# Patient Record
Sex: Male | Born: 1965 | Race: White | Hispanic: Yes | Marital: Single | Smoking: Current some day smoker
Health system: Southern US, Community
[De-identification: ages and names within clinical notes are randomized; demographics above are authoritative.]

## PROBLEM LIST (undated history)

## (undated) DIAGNOSIS — E785 Hyperlipidemia, unspecified: Secondary | ICD-10-CM

## (undated) HISTORY — PX: OTHER SURGICAL HISTORY: SHX169

## (undated) HISTORY — PX: VASECTOMY: SHX75

## (undated) HISTORY — PX: ROTATOR CUFF REPAIR: SHX139

## (undated) HISTORY — DX: Hyperlipidemia, unspecified: E78.5

---

## 2012-12-22 ENCOUNTER — Emergency Department: Payer: Self-pay | Admitting: Emergency Medicine

## 2013-06-05 ENCOUNTER — Ambulatory Visit: Payer: Self-pay | Admitting: Internal Medicine

## 2013-10-20 IMAGING — US US RENAL KIDNEY
1 series · 14 of 25 positions shown · non-contrast
Comparison: none

REASON FOR EXAM: Left flank pain Mild hematuria
COMMENTS:

[Series 1: us renal kidney · 0.28mm/px · 14 of 40 slices shown]
[im 1/40]
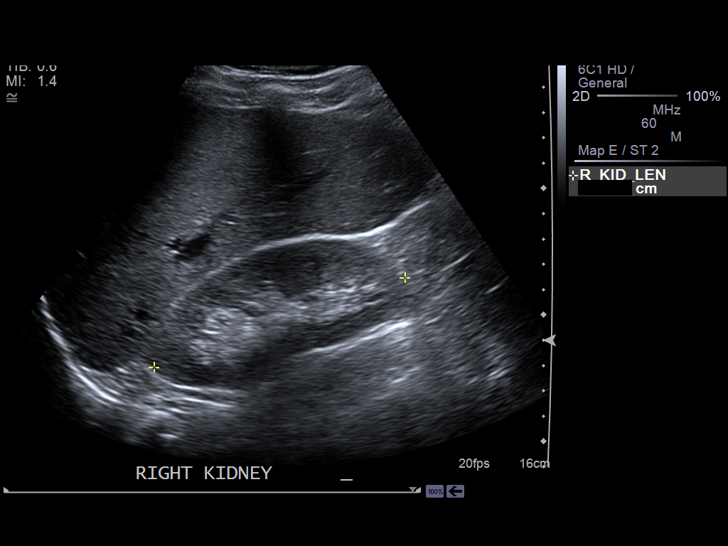
[im 4/40]
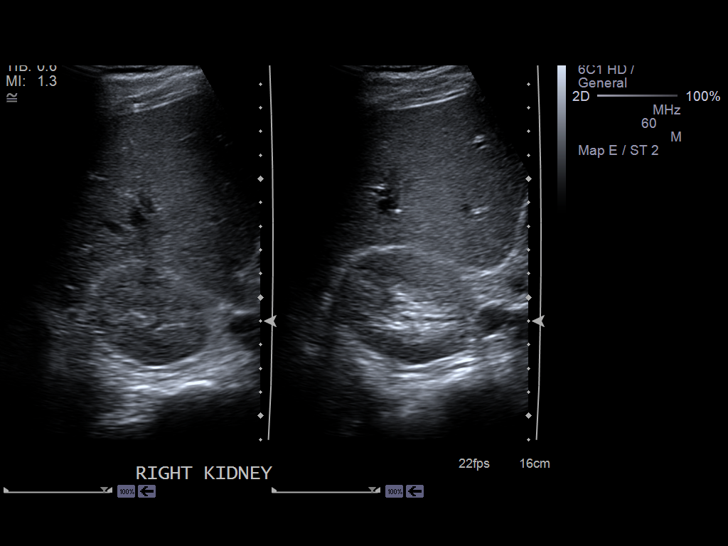
[im 7/40]
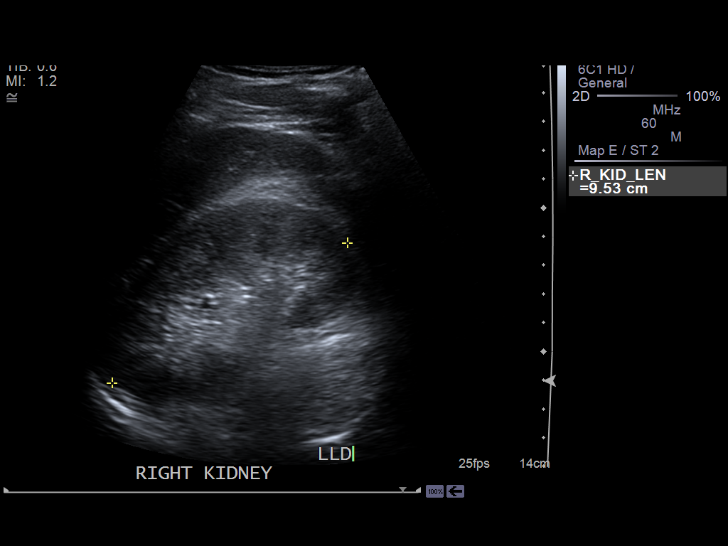
[im 10/40]
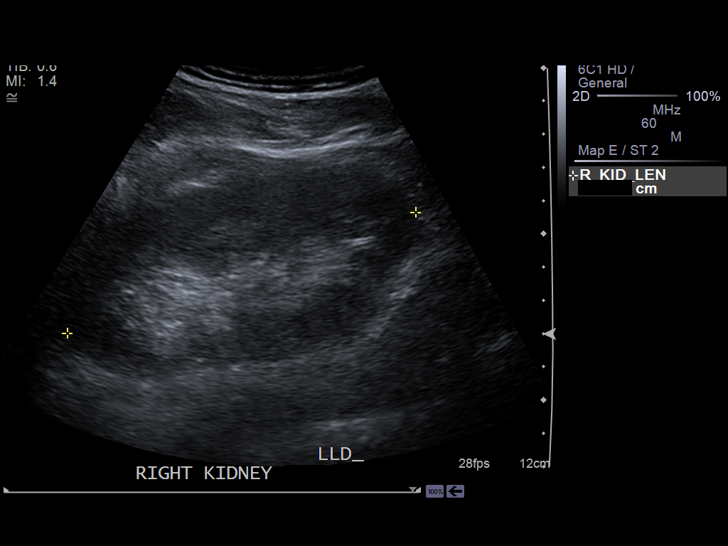
[im 14/40]
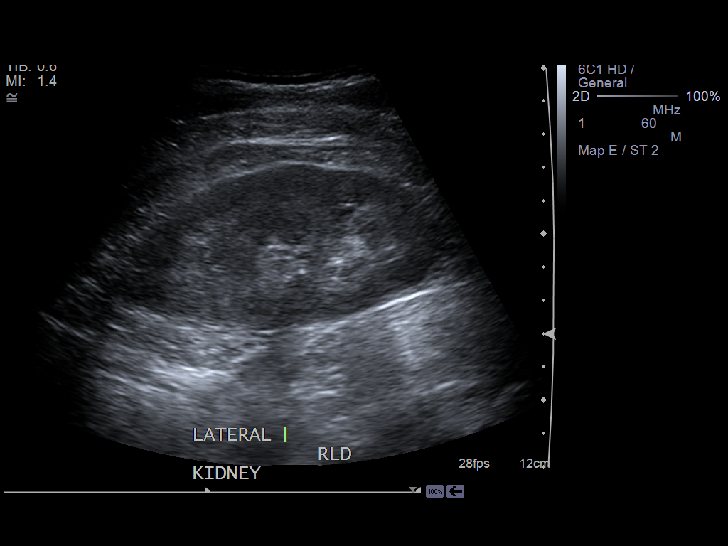
[im 15/40]
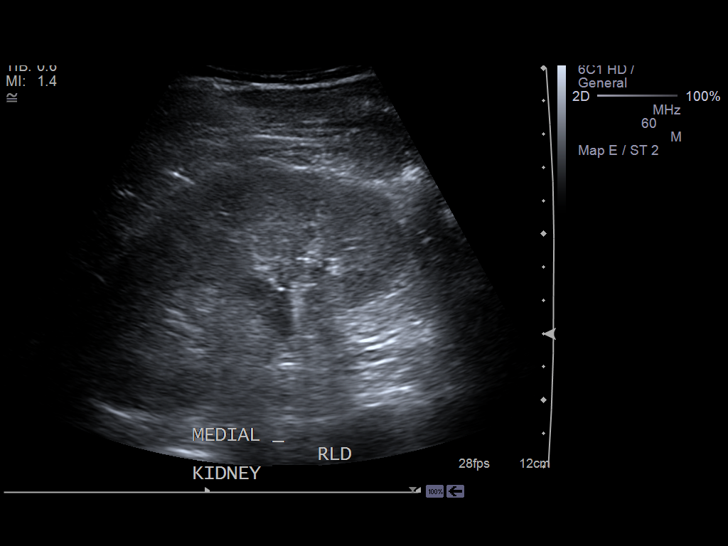
[im 18/40]
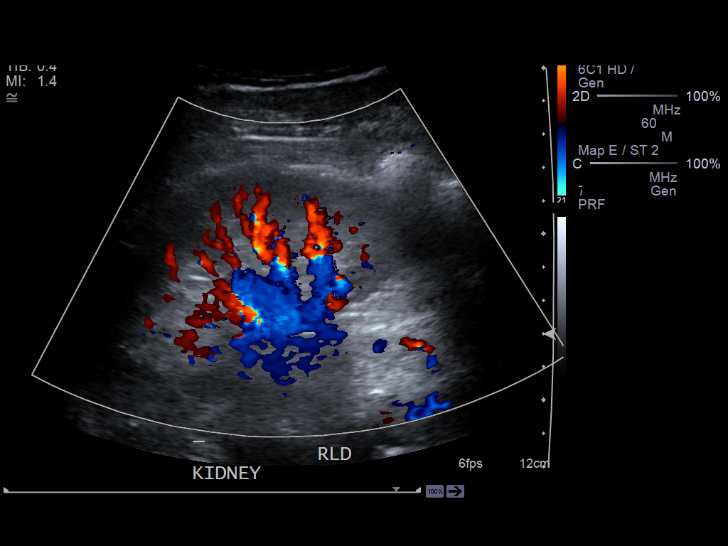
[im 22/40]
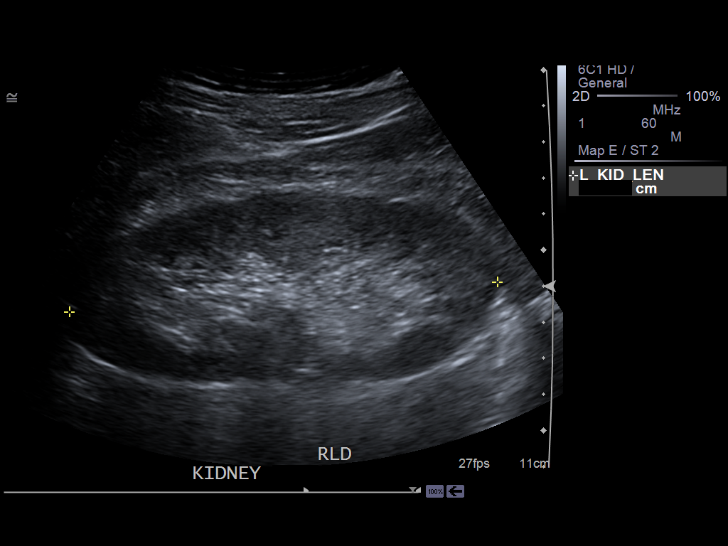
[im 25/40]
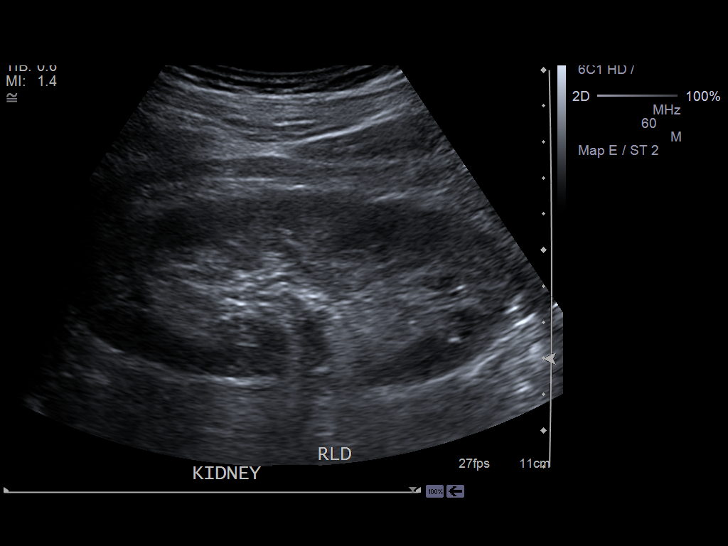
[im 27/40]
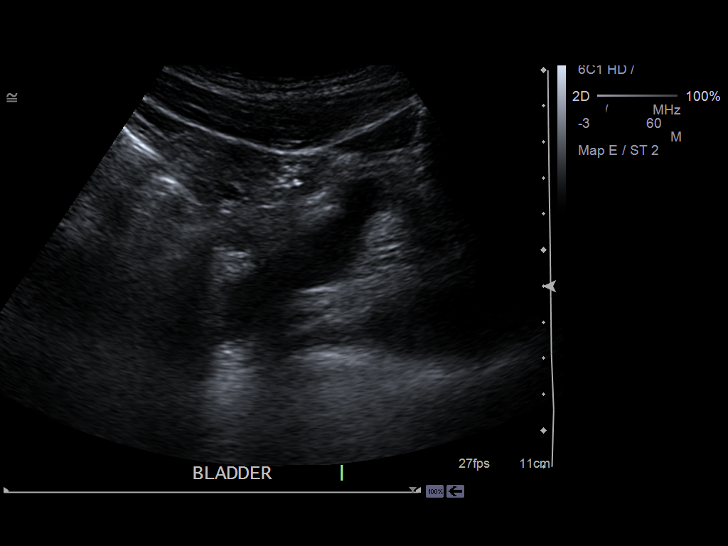
[im 30/40]
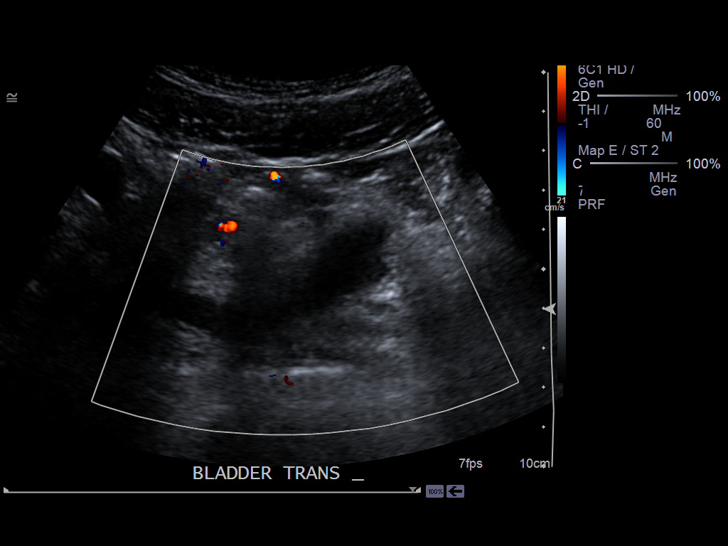
[im 33/40]
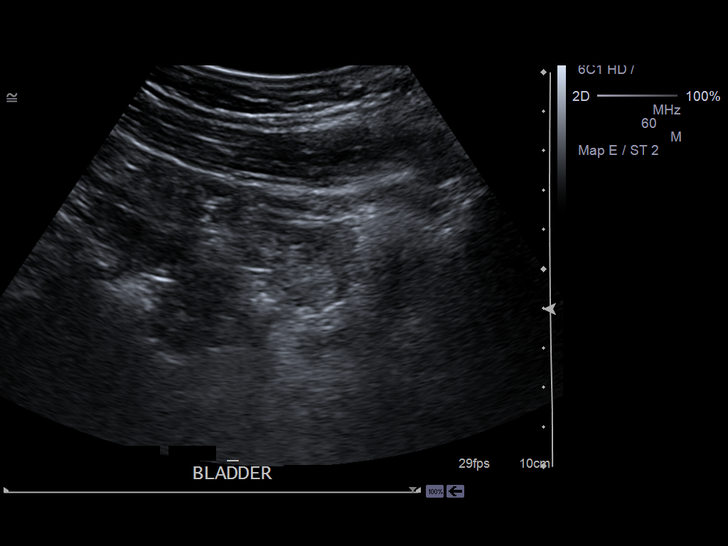
[im 36/40]
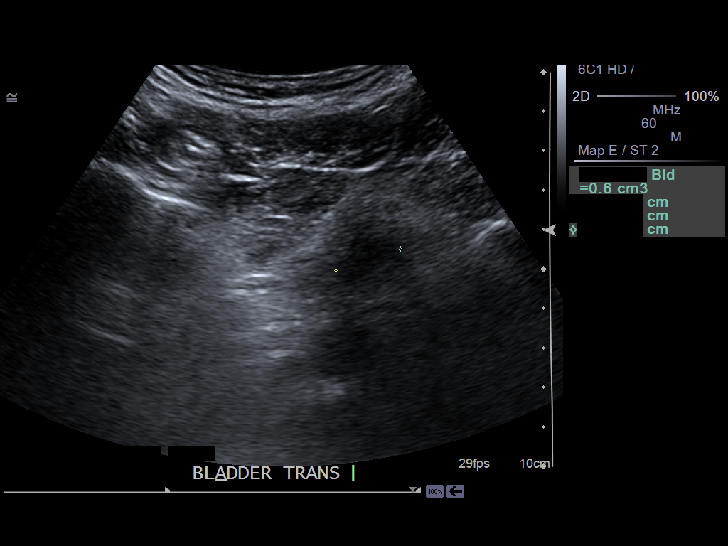
[im 40/40]
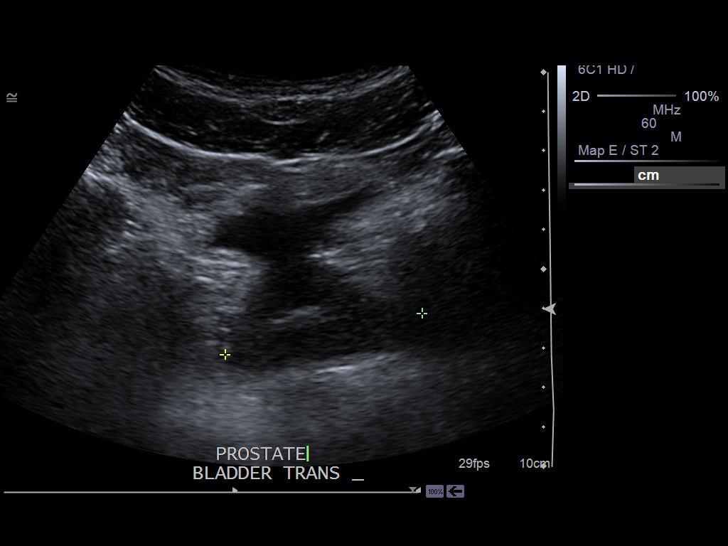

[14 of 25 positions shown; findings below may reference images not displayed]

PROCEDURE:     [facility] - [facility] KIDNE[facility]  - June 05, 2013  [DATE]

RESULT:     Comparison: None

Technique and findings: Multiple gray-scale and color doppler images of the
kidneys were obtained.

The right kidney measures 11.1 x 5.9 x 5.3 cm and the left kidney measures
11.9 x 5.2 x 5.3 cm. The kidneys are normal in echogenicity. There is no
hydronephrosis.  There are no echogenic foci.  There are no renal masses.
There is no free fluid in the region of the renal fossa. The prevoid bladder
volume measures 31.7 cc. The post void bladder volume measures 0.63 cc. The
prostate is mildly enlarged measuring 5.6 x 2.6 x 5.1 cm.
IMPRESSION: Normal renal ultrasound.

Prostatomegaly. Correlate with physical exam and laboratory values.

[REDACTED]

## 2015-02-27 ENCOUNTER — Ambulatory Visit: Payer: Self-pay | Admitting: Internal Medicine

## 2015-07-14 IMAGING — CR DG CHEST 2V
1 series · 2 of 2 positions shown · non-contrast
Comparison: None.

CLINICAL DATA: Chronic intermittent left posterior upper chest pain
for the past year, nonsmoker

EXAM:
CHEST  2 VIEW

[Series 1: pa · 0.17mm/px · 2 of 2 slices shown]
[im 1/2]
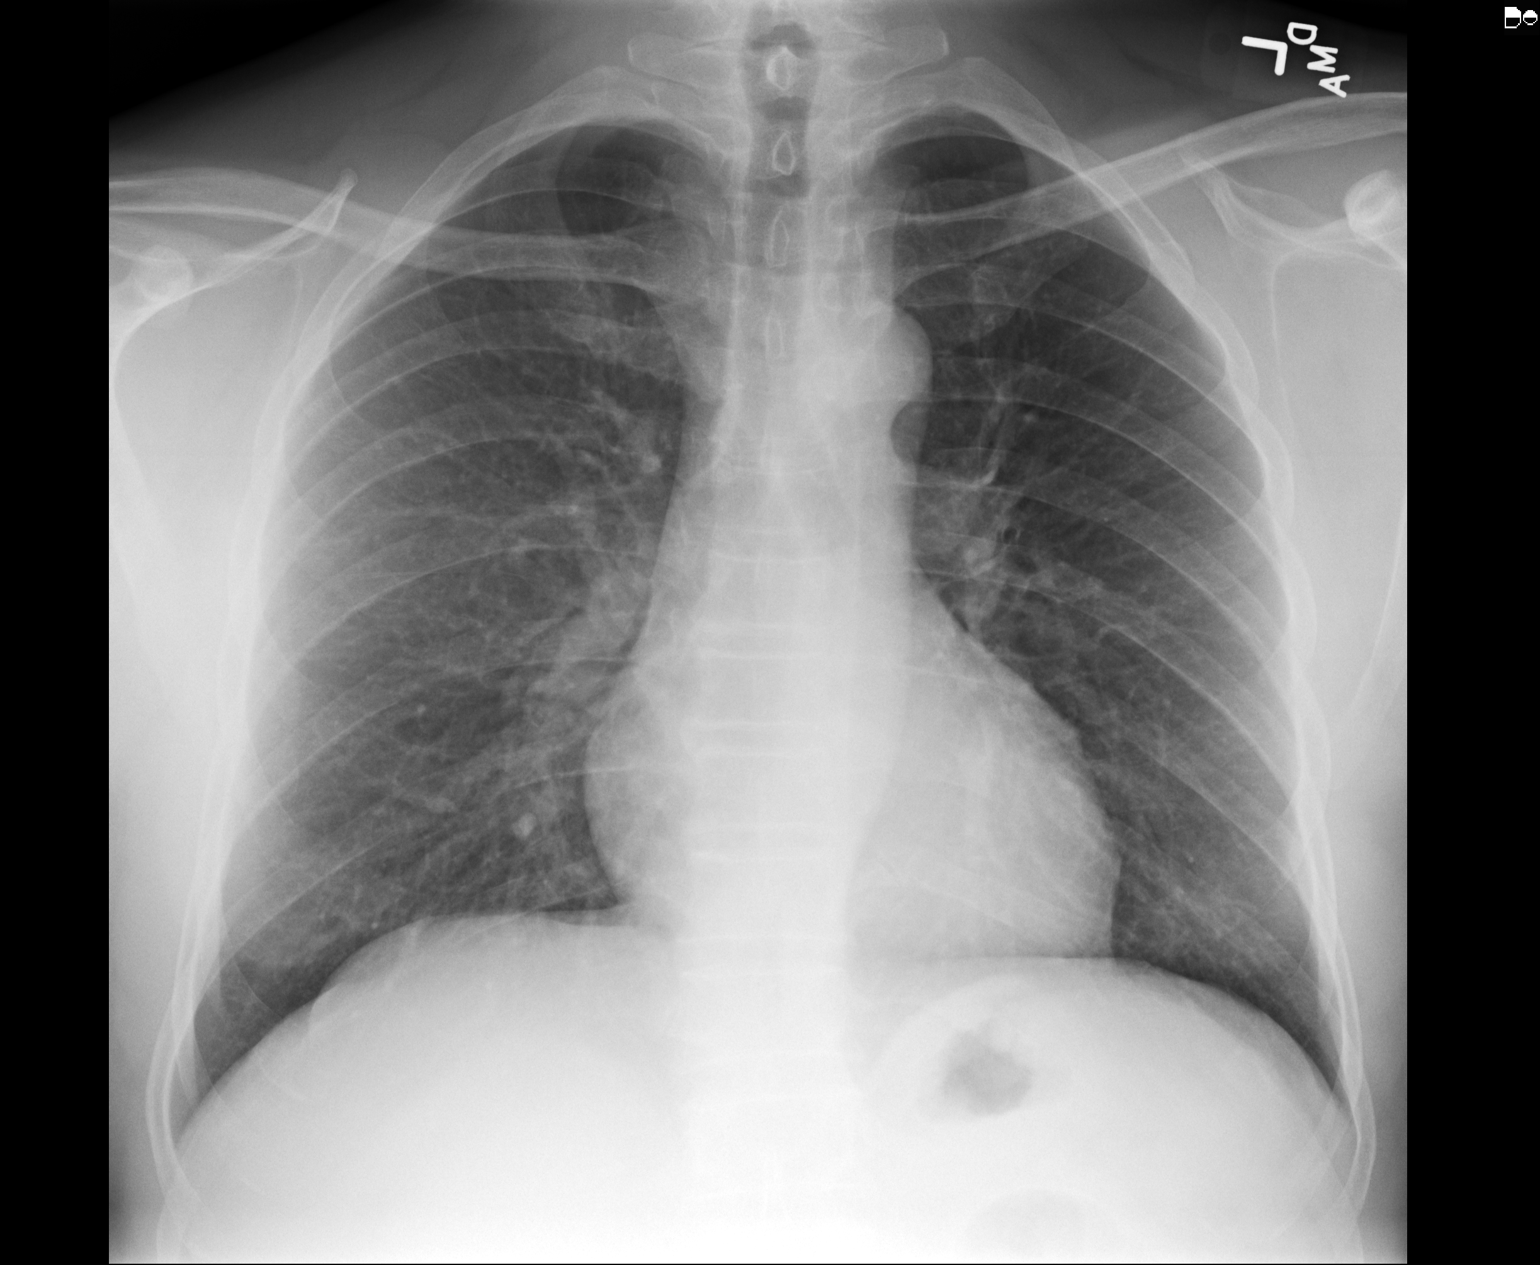
[im 2/2]
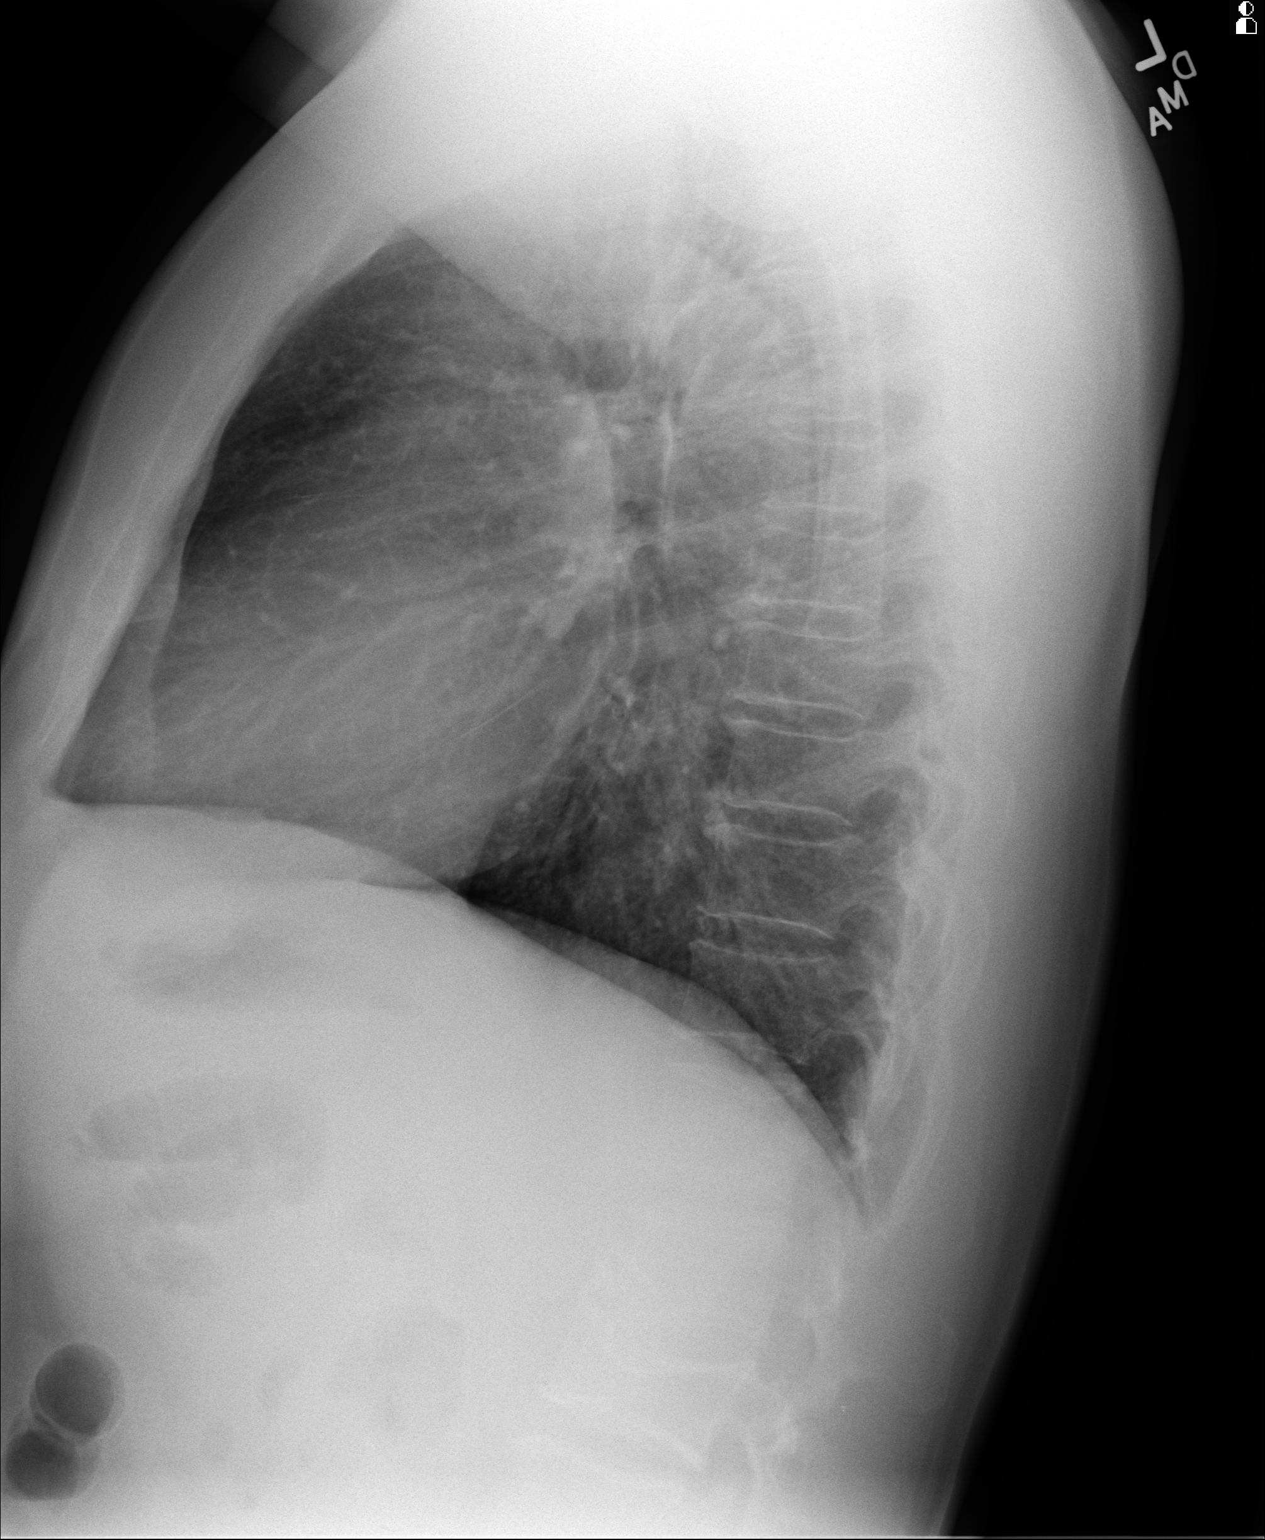

[2 of 2 positions shown; findings below may reference images not displayed]

FINDINGS: The lungs are adequately inflated and clear. The heart and pulmonary
vascularity are normal. The mediastinum is normal in width. There is
no pleural effusion. There is mild tortuosity of the descending
thoracic aorta. There is loss of height of the body of L2 or L3.
IMPRESSION: There is no active cardiopulmonary disease. No bony abnormality of
the thorax is demonstrated. There is 40% anterior compression of an
upper lumbar vertebral body.

## 2016-09-27 ENCOUNTER — Encounter: Payer: Self-pay | Admitting: Podiatry

## 2016-09-27 ENCOUNTER — Ambulatory Visit (INDEPENDENT_AMBULATORY_CARE_PROVIDER_SITE_OTHER): Payer: BLUE CROSS/BLUE SHIELD

## 2016-09-27 ENCOUNTER — Ambulatory Visit (INDEPENDENT_AMBULATORY_CARE_PROVIDER_SITE_OTHER): Payer: BLUE CROSS/BLUE SHIELD | Admitting: Podiatry

## 2016-09-27 VITALS — BP 139/96 | HR 59 | Resp 16 | Ht 68.0 in | Wt 190.0 lb

## 2016-09-27 DIAGNOSIS — M79672 Pain in left foot: Secondary | ICD-10-CM

## 2016-09-27 DIAGNOSIS — M722 Plantar fascial fibromatosis: Secondary | ICD-10-CM

## 2016-09-27 DIAGNOSIS — B353 Tinea pedis: Secondary | ICD-10-CM

## 2016-09-27 MED ORDER — CLOTRIMAZOLE-BETAMETHASONE 1-0.05 % EX CREA: 1.0000 "application " | TOPICAL_CREAM | Freq: Two times a day (BID) | CUTANEOUS | 2 refills | Status: DC

## 2016-09-27 MED ORDER — TERBINAFINE HCL 250 MG PO TABS: 250.0000 mg | ORAL_TABLET | Freq: Every day | ORAL | 0 refills | Status: DC

## 2016-09-27 NOTE — Patient Instructions (Signed)
Plantar Fasciitis Plantar fasciitis is a painful foot condition that affects the heel. It occurs when the band of tissue that connects the toes to the heel bone (plantar fascia) becomes irritated. This can happen after exercising too much or doing other repetitive activities (overuse injury). The pain from plantar fasciitis can range from mild irritation to severe pain that makes it difficult for you to walk or move. The pain is usually worse in the morning or after you have been sitting or lying down for a while. CAUSES This condition may be caused by:  Standing for long periods of time.  Wearing shoes that do not fit.  Doing high-impact activities, including running, aerobics, and ballet.  Being overweight.  Having an abnormal way of walking (gait).  Having tight calf muscles.  Having high arches in your feet.  Starting a new athletic activity. SYMPTOMS The main symptom of this condition is heel pain. Other symptoms include:  Pain that gets worse after activity or exercise.  Pain that is worse in the morning or after resting.  Pain that goes away after you walk for a few minutes. DIAGNOSIS This condition may be diagnosed based on your signs and symptoms. Your health care provider will also do a physical exam to check for:  A tender area on the bottom of your foot.  A high arch in your foot.  Pain when you move your foot.  Difficulty moving your foot. You may also need to have imaging studies to confirm the diagnosis. These can include:  X-rays.  Ultrasound.  MRI. TREATMENT  Treatment for plantar fasciitis depends on the severity of the condition. Your treatment may include:  Rest, ice, and over-the-counter pain medicines to manage your pain.  Exercises to stretch your calves and your plantar fascia.  A splint that holds your foot in a stretched, upward position while you sleep (night splint).  Physical therapy to relieve symptoms and prevent problems in the  future.  Cortisone injections to relieve severe pain.  Extracorporeal shock wave therapy (ESWT) to stimulate damaged plantar fascia with electrical impulses. It is often used as a last resort before surgery.  Surgery, if other treatments have not worked after 12 months. HOME CARE INSTRUCTIONS  Take medicines only as directed by your health care provider.  Avoid activities that cause pain.  Roll the bottom of your foot over a bag of ice or a bottle of cold water. Do this for 20 minutes, 3-4 times a day.  Perform simple stretches as directed by your health care provider.  Try wearing athletic shoes with air-sole or gel-sole cushions or soft shoe inserts.  Wear a night splint while sleeping, if directed by your health care provider.  Keep all follow-up appointments with your health care provider. PREVENTION   Do not perform exercises or activities that cause heel pain.  Consider finding low-impact activities if you continue to have problems.  Lose weight if you need to. The best way to prevent plantar fasciitis is to avoid the activities that aggravate your plantar fascia. SEEK MEDICAL CARE IF:  Your symptoms do not go away after treatment with home care measures.  Your pain gets worse.  Your pain affects your ability to move or do your daily activities.   This information is not intended to replace advice given to you by your health care provider. Make sure you discuss any questions you have with your health care provider.   Document Released: 09/06/2001 Document Revised: 09/02/2015 Document Reviewed: 10/22/2014 Elsevier   Interactive Patient Education 2016 [employer]  

## 2016-09-27 NOTE — Progress Notes (Signed)
   Subjective:    Patient ID: Gavin Chaney, male    DOB: 18-May-1966, 50 y.o.   MRN: [id number]  HPI    Review of Systems  Constitutional: Positive for fatigue.  HENT: Positive for tinnitus.   Musculoskeletal: Positive for back pain.       Objective:   Physical Exam        Assessment & Plan:

## 2016-10-02 MED ORDER — BETAMETHASONE SOD PHOS & ACET 6 (3-3) MG/ML IJ SUSP: 12.0000 mg | Freq: Once | INTRAMUSCULAR | Status: DC

## 2016-10-02 NOTE — Progress Notes (Signed)
Patient ID: Gavin ArmsDaniel R Chaney, male   DOB: 21-Feb-1966, 50 y.o.   MRN: [id number] Subjective: Patient presents today for pain and tenderness in the left foot. Patient states the foot pain has been hurting for several weeks now. Patient states that it hurts in the mornings with the first steps out of bed. Patient states that he's noticed the pain for proximal he 6 months now. Patient also has a complaint of itching and dry skin to the bilateral feet.  Patient presents today for further treatment and evaluation  Objective: Physical Exam General: The patient is alert and oriented x3 in no acute distress.  Dermatology: Severe right dry skin noted to the bilateral feet consistent with moccasin-type tinea pedis. Pruritus noted. Skin is warm, dry and supple bilateral lower extremities. Negative for open lesions or macerations bilateral.   Vascular: Dorsalis Pedis and Posterior Tibial pulses palpable bilateral.  Capillary fill time is immediate to all digits.  Neurological: Epicritic and protective threshold intact bilateral.   Musculoskeletal: Tenderness to palpation at the medial calcaneal tubercale and through the insertion of the plantar fascia of the left foot. All other joints range of motion within normal limits bilateral. Strength 5/5 in all groups bilateral.   Radiographic exam: Normal osseous mineralization. Joint spaces preserved. No fracture/dislocation/boney destruction. Calcaneal spur present with mild thickening of plantar fascia left. No other soft tissue abnormalities or radiopaque foreign bodies.   Assessment: 1. Plantar fasciitis left foot 2. Pain in left foot 3. Tinea pedis bilateral feet  Plan of Care:  1. Patient evaluated. Xrays reviewed.   2. Injection of 0.5cc Celestone soluspan injected into the left plantar fascia.  3. Instructed patient regarding therapies and modalities at home to alleviate symptoms.  4. Patient is to continue meloxicam did prescribed from his primary  care doctor 5. Prescription for Lamisil 28 pills and Lotrisone cream prescribed today 6. Plantar fascial band(s) dispensed. 7. Today the patient was scanned for custom molded orthotics.  Return to clinic in 4 weeks.

## 2016-10-25 ENCOUNTER — Ambulatory Visit (INDEPENDENT_AMBULATORY_CARE_PROVIDER_SITE_OTHER): Payer: BLUE CROSS/BLUE SHIELD | Admitting: Podiatry

## 2016-10-25 DIAGNOSIS — M79672 Pain in left foot: Secondary | ICD-10-CM

## 2016-10-25 DIAGNOSIS — M722 Plantar fascial fibromatosis: Secondary | ICD-10-CM

## 2016-10-25 DIAGNOSIS — B353 Tinea pedis: Secondary | ICD-10-CM

## 2016-10-25 MED ORDER — TERBINAFINE HCL 250 MG PO TABS: 250.0000 mg | ORAL_TABLET | Freq: Every day | ORAL | 0 refills | Status: DC

## 2016-10-25 MED ORDER — MELOXICAM 15 MG PO TABS: 15.0000 mg | ORAL_TABLET | Freq: Every day | ORAL | 1 refills | Status: AC

## 2016-10-26 ENCOUNTER — Encounter: Payer: Self-pay | Admitting: Podiatry

## 2016-10-30 NOTE — Progress Notes (Signed)
Patient ID: Gavin ArmsDaniel R Ohlsen, male   DOB: 1966-09-18, 50 y.o.   MRN: [id number] Subjective: Patient presents today for pain and tenderness in the left foot. Patient states the foot pain has been hurting for several weeks now. Patient states that it hurts in the mornings with the first steps out of bed. Patient states that he's noticed the pain for proximal he 6 months now. Patient also has a complaint of itching and dry skin to the bilateral feet.  Patient presents today for further treatment and evaluation  Objective: Physical Exam General: The patient is alert and oriented x3 in no acute distress.  Dermatology: Severe right dry skin noted to the bilateral feet consistent with moccasin-type tinea pedis. Pruritus noted. Skin is warm, dry and supple bilateral lower extremities. Negative for open lesions or macerations bilateral.   Vascular: Dorsalis Pedis and Posterior Tibial pulses palpable bilateral.  Capillary fill time is immediate to all digits.  Neurological: Epicritic and protective threshold intact bilateral.   Musculoskeletal: Tenderness to palpation at the medial calcaneal tubercale and through the insertion of the plantar fascia of the left foot. All other joints range of motion within normal limits bilateral. Strength 5/5 in all groups bilateral.   Radiographic exam: Normal osseous mineralization. Joint spaces preserved. No fracture/dislocation/boney destruction. Calcaneal spur present with mild thickening of plantar fascia left. No other soft tissue abnormalities or radiopaque foreign bodies.   Assessment: 1. Plantar fasciitis left foot 2. Pain in left foot 3. Tinea pedis bilateral feet  Plan of Care:  1. Patient evaluated. Xrays reviewed.   2. Injection of 0.5cc Celestone soluspan injected into the left plantar fascia.  3. Instructed patient regarding therapies and modalities at home to alleviate symptoms.  4. Patient is to continue meloxicam did prescribed from his primary  care doctor. Today meloxicam prescription was refilled 5. Prescription for Lamisil 28 pills and Lotrisone cream prescribed today 6. Today the patient was re-scanned for custom molded orthotics since the order was lost last visit. 7. Return to clinic in 4 weeks for orthotic pickup.

## 2016-11-22 ENCOUNTER — Ambulatory Visit (INDEPENDENT_AMBULATORY_CARE_PROVIDER_SITE_OTHER): Payer: BLUE CROSS/BLUE SHIELD | Admitting: Podiatry

## 2016-11-22 DIAGNOSIS — M722 Plantar fascial fibromatosis: Secondary | ICD-10-CM

## 2016-11-22 NOTE — Patient Instructions (Signed)

## 2016-11-27 NOTE — Progress Notes (Signed)
Patient presents for orthotics pickup. Orthotics were dispensed today with both verbal and written instructions for orthotics break-in period and care. Return to clinic in 3 weeks for follow-up evaluation.  Brent M. Evans, DPM [facility]  Dr. Brent M. Evans, DPM   [address]                                        , [place] [address]                Office [phone]  Fax [phone]   

## 2016-12-23 ENCOUNTER — Encounter: Payer: Self-pay | Admitting: Podiatry

## 2016-12-23 ENCOUNTER — Ambulatory Visit (INDEPENDENT_AMBULATORY_CARE_PROVIDER_SITE_OTHER): Payer: BLUE CROSS/BLUE SHIELD | Admitting: Podiatry

## 2016-12-23 DIAGNOSIS — M79672 Pain in left foot: Secondary | ICD-10-CM | POA: Diagnosis not present

## 2016-12-23 DIAGNOSIS — M722 Plantar fascial fibromatosis: Secondary | ICD-10-CM

## 2016-12-23 MED ORDER — METHYLPREDNISOLONE 4 MG PO TBPK: ORAL_TABLET | ORAL | 0 refills | Status: DC

## 2016-12-23 MED ORDER — BETAMETHASONE SOD PHOS & ACET 6 (3-3) MG/ML IJ SUSP: 3.0000 mg | Freq: Once | INTRAMUSCULAR | Status: DC

## 2016-12-23 NOTE — Progress Notes (Signed)
Subjective: Patient presents today for follow-up evaluation of plantar fasciitis to the left foot. Patient states that he still has ongoing pain. Patient received custom molded orthotics on 11/22/2016. Patient states that his pain is intermittent with some days better than others. Patient exercises at [employer] and he works on his feet all day. Patient presents today for follow-up evaluation and treatment.  Objective: Physical Exam General: The patient is alert and oriented x3 in no acute distress.  Dermatology: Skin is warm, dry and supple bilateral lower extremities. Negative for open lesions or macerations bilateral.   Vascular: Dorsalis Pedis and Posterior Tibial pulses palpable bilateral.  Capillary fill time is immediate to all digits.  Neurological: Epicritic and protective threshold intact bilateral.   Musculoskeletal: Tenderness to palpation at the medial calcaneal tubercale and through the insertion of the plantar fascia of the left foot. All other joints range of motion within normal limits bilateral. Strength 5/5 in all groups bilateral.   Radiographic exam:   Normal osseous mineralization. Joint spaces preserved. No fracture/dislocation/boney destruction. Calcaneal spur present with mild thickening of plantar fascia left. No other soft tissue abnormalities or radiopaque foreign bodies.   Assessment: 1. Plantar fasciitis left foot 2. Pain in left foot  Plan of Care:   1. Patient evaluated. Xrays reviewed.   2. Injection of 0.5cc Celestone soluspan injected into the left plantar fascia.  3. Instructed patient regarding therapies and modalities at home to alleviate symptoms.  4. Prescription for Medrol Dosepak 5. Continue custom orthotics, plantar fascial band, meloxicam, and daily stretching exercises 6. Return to clinic in 4 weeks

## 2017-01-24 ENCOUNTER — Ambulatory Visit (INDEPENDENT_AMBULATORY_CARE_PROVIDER_SITE_OTHER): Payer: BLUE CROSS/BLUE SHIELD | Admitting: Podiatry

## 2017-01-24 DIAGNOSIS — M722 Plantar fascial fibromatosis: Secondary | ICD-10-CM | POA: Diagnosis not present

## 2017-01-24 DIAGNOSIS — M79672 Pain in left foot: Secondary | ICD-10-CM

## 2017-01-25 ENCOUNTER — Telehealth: Payer: Self-pay | Admitting: *Deleted

## 2017-01-25 DIAGNOSIS — M79672 Pain in left foot: Secondary | ICD-10-CM

## 2017-01-25 DIAGNOSIS — M722 Plantar fascial fibromatosis: Secondary | ICD-10-CM

## 2017-01-25 NOTE — Telephone Encounter (Addendum)
-----   Message from Felecia ShellingBrent M Evans, DPM sent at 01/24/2017 10:19 AM EST ----- Regarding: Physical Therapy Please arrange physical therapy 3x/week x 4 weeks. Patient does not want to go to [facility].   Dx: plantar fasciitis left foot.   Thanks, Dr. Logan BoresEvans. 01/25/2017-Faxed required form, demographics and LOV to [employer].

## 2017-02-04 NOTE — Progress Notes (Signed)
Subjective: Patient presents today for follow-up treatment of plantar fasciitis left foot. Patient states that he is not any better. Patient is wearing his custom molded orthotics which she received 11/22/2016. Patient states that he has good days and bad days. There are no alleviating or aggravating factors that he can think of.  Objective: Physical Exam General: The patient is alert and oriented x3 in no acute distress.  Dermatology: Skin is warm, dry and supple bilateral lower extremities. Negative for open lesions or macerations bilateral.   Vascular: Dorsalis Pedis and Posterior Tibial pulses palpable bilateral.  Capillary fill time is immediate to all digits.  Neurological: Epicritic and protective threshold intact bilateral.   Musculoskeletal: Tenderness to palpation at the medial calcaneal tubercale and through the insertion of the plantar fascia of the left foot. All other joints range of motion within normal limits bilateral. Strength 5/5 in all groups bilateral.   Radiographic exam:   Normal osseous mineralization. Joint spaces preserved. No fracture/dislocation/boney destruction. Calcaneal spur present with mild thickening of plantar fascia left. No other soft tissue abnormalities or radiopaque foreign bodies.   Assessment: 1. Plantar fasciitis left foot 2. Pain in left foot  Plan of Care:   1. Patient evaluated. Xrays reviewed.   2. Today we discussed additional modalities including physical therapy and shockwave therapy as well as possible surgery. 3. Today redressed and the patient to physical therapy 3 times per week 4 weeks 4. Return to clinic in 4 weeks  5. The patient is not better we will further discuss shockwave EPAT versus surgical intervention

## 2017-02-21 ENCOUNTER — Ambulatory Visit (INDEPENDENT_AMBULATORY_CARE_PROVIDER_SITE_OTHER): Payer: BLUE CROSS/BLUE SHIELD | Admitting: Podiatry

## 2017-02-21 ENCOUNTER — Encounter: Payer: Self-pay | Admitting: Podiatry

## 2017-02-21 DIAGNOSIS — M722 Plantar fascial fibromatosis: Secondary | ICD-10-CM | POA: Diagnosis not present

## 2017-02-21 DIAGNOSIS — M79672 Pain in left foot: Secondary | ICD-10-CM

## 2017-02-21 MED ORDER — MELOXICAM 15 MG PO TABS: 15.0000 mg | ORAL_TABLET | Freq: Every day | ORAL | 3 refills | Status: DC

## 2017-02-26 NOTE — Progress Notes (Signed)
Subjective: Patient presents today for follow-up treatment of plantar fasciitis to the left foot. Patient states that the left foot pain is worse. He states that physical therapy is not helping. Patient is very discouraged because he has been dealing with this pathology for several months now. Physical therapy not helping. Injections did not help. Patient is wearing orthotics that he states they are not helping to alleviate symptoms. She states that he has good days and bad days.  Objective: Physical Exam General: The patient is alert and oriented x3 in no acute distress.  Dermatology: Skin is warm, dry and supple bilateral lower extremities. Negative for open lesions or macerations bilateral.   Vascular: Dorsalis Pedis and Posterior Tibial pulses palpable bilateral.  Capillary fill time is immediate to all digits.  Neurological: Epicritic and protective threshold intact bilateral.   Musculoskeletal: Tenderness to palpation at the medial calcaneal tubercale and through the insertion of the plantar fascia of the left foot. All other joints range of motion within normal limits bilateral. Strength 5/5 in all groups bilateral.   Assessment: 1. Plantar fasciitis left foot 2. Pain in left foot  Plan of Care:   1. Patient evaluated. Xrays reviewed.   2. Today were going to [place] EPAT shock[place]  3. Discontinue physical therapy 4. Prescription for meloxicam 15 mg 5. Return to clinic after the full course and treatment of EPAT

## 2017-02-27 ENCOUNTER — Ambulatory Visit: Payer: Self-pay | Admitting: Podiatry

## 2017-02-27 DIAGNOSIS — M722 Plantar fascial fibromatosis: Secondary | ICD-10-CM

## 2017-02-27 DIAGNOSIS — R52 Pain, unspecified: Secondary | ICD-10-CM

## 2017-03-01 NOTE — Progress Notes (Signed)
   Subjective:    Patient ID: Gavin Chaney, male    DOB: 08/08/1966, 51 y.o.   MRN: [id number]  HPI Pt presents today stating that he has had chronic pain in his Lt heel, he has tried various therapies with not much success and is here today for ESWT treatment   Review of Systems  All other systems are negative Objective:   Physical Exam    Pain on palpation of left medial heel band with noted mild swelling      Assessment & Plan:  ESWT administered to Lt heel at 8.5 joules for 3000 pulses and tolerated well. EPAt therapy administered to entire plantar fascia for 3000 pulses. Advised to avoid regular use of anti-inflammatories and ice. Advised on supportive shoe usage. Follow up in 1 week for 2nd treatment

## 2017-03-07 ENCOUNTER — Ambulatory Visit (INDEPENDENT_AMBULATORY_CARE_PROVIDER_SITE_OTHER): Payer: BLUE CROSS/BLUE SHIELD

## 2017-03-07 DIAGNOSIS — M722 Plantar fascial fibromatosis: Secondary | ICD-10-CM

## 2017-03-13 ENCOUNTER — Ambulatory Visit: Payer: BLUE CROSS/BLUE SHIELD

## 2017-03-13 DIAGNOSIS — M722 Plantar fascial fibromatosis: Secondary | ICD-10-CM

## 2017-03-16 NOTE — Progress Notes (Signed)
   Subjective:    Patient ID: Gavin Chaney, male    DOB: 11-Jun-1966, 51 y.o.   MRN: [id number]  HPI Pt presents today stating that he has had chronic pain in his Lt heel, he has not noticed much improvement since starting therapy  Review of Systems  All other systems are negative Objective:   Physical Exam    Pain on palpation of left medial heel band with noted mild swelling      Assessment & Plan:  ESWT administered to Lt heel at 12 joules for 3000 pulses and tolerated well. EPAt therapy administered to entire plantar fascia for 3000 pulses. Advised to avoid regular use of anti-inflammatories and ice. Advised on supportive shoe usage. Follow up in 1 week for 3rd treatment

## 2017-03-29 NOTE — Progress Notes (Signed)
   Subjective:    Patient ID: Gavin Chaney, male    DOB: 1966-01-11, 51 y.o.   MRN: [id number]  HPI Pt presents today stating that he has had chronic pain in his Lt heel, he has tried various therapies with not much success and is here today for ESWT treatment. He has not noticed much improvement in his pain.   Review of Systems  All other systems are negative Objective:   Physical Exam    Pain on palpation of left medial heel band with noted mild swelling      Assessment & Plan:  ESWT administered to Lt heel at 12 joules for 3000 pulses and tolerated well. EPAt therapy administered to entire plantar fascia for 3000 pulses. Advised to avoid regular use of anti-inflammatories and ice. Advised on supportive shoe usage. Follow up in 4 weeks for 4th treatment.

## 2017-04-10 ENCOUNTER — Ambulatory Visit (INDEPENDENT_AMBULATORY_CARE_PROVIDER_SITE_OTHER): Payer: Self-pay | Admitting: Podiatry

## 2017-04-10 DIAGNOSIS — M722 Plantar fascial fibromatosis: Secondary | ICD-10-CM

## 2017-04-10 NOTE — Progress Notes (Signed)
   Subjective:    Patient ID: Gavin Chaney, male    DOB: Apr 11, 1966, 51 y.o.   MRN: [id number]  HPI Pt presents today stating that he has had chronic pain in his Lt heel, he has noticed slight  improvement since starting therapy  Review of Systems  All other systems are negative Objective:   Physical Exam    Pain on palpation of left medial heel band with noted mild swelling      Assessment & Plan:  ESWT administered to Lt heel at 15 joules for 3000 pulses and tolerated well. EPAt therapy administered to entire plantar fascia for 3000 pulses. Advised to avoid regular use of anti-inflammatories and ice. Advised on supportive shoe usage. Follow up in 1 week for 3rd treatment

## 2017-05-29 ENCOUNTER — Encounter: Payer: Self-pay | Admitting: Podiatry

## 2017-05-29 ENCOUNTER — Ambulatory Visit (INDEPENDENT_AMBULATORY_CARE_PROVIDER_SITE_OTHER): Payer: BLUE CROSS/BLUE SHIELD | Admitting: Podiatry

## 2017-05-29 DIAGNOSIS — M722 Plantar fascial fibromatosis: Secondary | ICD-10-CM

## 2017-06-13 NOTE — Progress Notes (Signed)
   Subjective:    Patient ID: Gavin Chaney, male    DOB: 1966-08-15, 51 y.o.   MRN: [id number]  HPI Pt presents today stating that he has had chronic pain in his Lt heel, he has tried various therapies with not much success and is here today for ESWT treatment. Review of Systems He has noticed an improvement in his pain and is able to increase activities with just experiencing a little soreness  All other systems are negative Objective:   Physical Exam    Pain on palpation of left medial heel band with noted mild swelling      Assessment & Plan:  ESWT administered to Lt heel at 10 joules for 3000 pulses and tolerated well. EPAt therapy administered to entire plantar fascia for 3000 pulses. Advised to avoid regular use of anti-inflammatories and ice. Advised on supportive shoe usage. Follow up in 4 weeks with Dr Logan BoresEvans for evaluation

## 2017-06-26 DIAGNOSIS — D72829 Elevated white blood cell count, unspecified: Secondary | ICD-10-CM | POA: Diagnosis not present

## 2017-07-03 ENCOUNTER — Ambulatory Visit (INDEPENDENT_AMBULATORY_CARE_PROVIDER_SITE_OTHER): Payer: BLUE CROSS/BLUE SHIELD | Admitting: Podiatry

## 2017-07-03 ENCOUNTER — Encounter: Payer: Self-pay | Admitting: Podiatry

## 2017-07-03 DIAGNOSIS — M722 Plantar fascial fibromatosis: Secondary | ICD-10-CM | POA: Diagnosis not present

## 2017-07-10 NOTE — Progress Notes (Signed)
Subjective: Patient presents today for follow-up treatment of plantar fasciitis to the left foot. He states his pain has improved he denies any new complaints at this time.  Objective: Physical Exam General: The patient is alert and oriented x3 in no acute distress.  Dermatology: Skin is warm, dry and supple bilateral lower extremities. Negative for open lesions or macerations bilateral.   Vascular: Dorsalis Pedis and Posterior Tibial pulses palpable bilateral.  Capillary fill time is immediate to all digits.  Neurological: Epicritic and protective threshold intact bilateral.   Musculoskeletal: Negative for tenderness to palpation at the medial calcaneal tubercale and through the insertion of the plantar fascia of the left foot. All other joints range of motion within normal limits bilateral. Strength 5/5 in all groups bilateral.   Assessment: 1. Plantar fasciitis left foot- resolved  Plan of Care:   1. Patient evaluated.  2. Continue wearing good shoes gear and insoles. 3. Return to clinic when necessary.

## 2017-10-16 DIAGNOSIS — H43812 Vitreous degeneration, left eye: Secondary | ICD-10-CM | POA: Diagnosis not present

## 2018-02-21 DIAGNOSIS — M25552 Pain in left hip: Secondary | ICD-10-CM | POA: Diagnosis not present

## 2018-11-19 DIAGNOSIS — Z131 Encounter for screening for diabetes mellitus: Secondary | ICD-10-CM | POA: Diagnosis not present

## 2018-11-19 DIAGNOSIS — Z1211 Encounter for screening for malignant neoplasm of colon: Secondary | ICD-10-CM | POA: Diagnosis not present

## 2018-11-19 DIAGNOSIS — Z0000 Encounter for general adult medical examination without abnormal findings: Secondary | ICD-10-CM | POA: Diagnosis not present

## 2018-11-19 DIAGNOSIS — Z13220 Encounter for screening for lipoid disorders: Secondary | ICD-10-CM | POA: Diagnosis not present

## 2018-11-19 DIAGNOSIS — R6882 Decreased libido: Secondary | ICD-10-CM | POA: Diagnosis not present

## 2019-01-21 ENCOUNTER — Encounter: Payer: Self-pay | Admitting: Nurse Practitioner

## 2019-01-21 DIAGNOSIS — E785 Hyperlipidemia, unspecified: Secondary | ICD-10-CM | POA: Insufficient documentation

## 2019-01-23 ENCOUNTER — Other Ambulatory Visit: Payer: Self-pay

## 2019-01-23 ENCOUNTER — Ambulatory Visit (INDEPENDENT_AMBULATORY_CARE_PROVIDER_SITE_OTHER): Payer: BLUE CROSS/BLUE SHIELD | Admitting: Nurse Practitioner

## 2019-01-23 ENCOUNTER — Encounter: Payer: Self-pay | Admitting: Nurse Practitioner

## 2019-01-23 VITALS — BP 121/82 | HR 73 | Temp 98.5°F | Ht 66.5 in | Wt 182.0 lb

## 2019-01-23 DIAGNOSIS — I10 Essential (primary) hypertension: Secondary | ICD-10-CM

## 2019-01-23 DIAGNOSIS — G8929 Other chronic pain: Secondary | ICD-10-CM

## 2019-01-23 DIAGNOSIS — M25512 Pain in left shoulder: Secondary | ICD-10-CM

## 2019-01-23 DIAGNOSIS — M25511 Pain in right shoulder: Secondary | ICD-10-CM

## 2019-01-23 DIAGNOSIS — E782 Mixed hyperlipidemia: Secondary | ICD-10-CM

## 2019-01-23 MED ORDER — CYCLOBENZAPRINE HCL 10 MG PO TABS: 10.0000 mg | ORAL_TABLET | Freq: Three times a day (TID) | ORAL | 0 refills | Status: DC | PRN

## 2019-01-23 NOTE — Progress Notes (Signed)
New Patient Office Visit  Subjective:  Patient ID: Gavin Chaney, male    DOB: 30-Aug-1966  Age: 53 y.o. MRN: [id number]  CC:  Chief Complaint  Patient presents with  . Establish Care    pt would like to discuss bilateral shoulder pain every single day, gets worse during night    HPI Gavin Chaney presents for new patient visit to establish care.  Introduced to Publishing rights managernurse practitioner role and practice setting.  All questions answered.  HYPERTENSION / HYPERLIPIDEMIA No current medications.  Noticed on previous provider visits and labs. Satisfied with current treatment? yes Duration of hypertension: years BP monitoring frequency: not checking BP range:  Duration of hyperlipidemia: years Aspirin: no Recent stressors: no Recurrent headaches: no Visual changes: no Palpitations: no Dyspnea: no Chest pain: no Lower extremity edema: no Dizzy/lightheaded: no   BILATERAL SHOULDER DISCOMFORT: Has history of shoulder surgery, left side, in 1998 or 2000.  Since this surgery he reports shoulder has never been the same and "always had pain".  Right shoulder started hurting two years ago.  Performs lots of lifting at his work place and used to [employer] weights, but reports he has not been able to lift weights for months because it makes pain worse.  Reports that at times he has taken Ibuprofen at home, but this does not help.  States he has also used all gels and Tylenol with no relief.  Does endorse weakness in bilateral upper extremity.  Pain is constant when at worst is 9/10 and at best 5/10.  Denies any radiation of pain.  Pain is worse at night and interrupts his sleep.  We discussed multiple options.  He refuses physical therapy, stating "I don't believe in that stuff it has not worked".  Would prefer ortho referral at this time since pain has been present since his past surgery and now right shoulder also with pain.  Past Medical History:  Diagnosis Date  . Hyperlipidemia     Past  Surgical History:  Procedure Laterality Date  . ROTATOR CUFF REPAIR    . testcles surgery    . VASECTOMY      Family History  Problem Relation Age of Onset  . Hypertension Sister     Social History   Socioeconomic History  . Marital status: Single    Spouse name: Not on file  . Number of children: Not on file  . Years of education: Not on file  . Highest education level: Not on file  Occupational History  . Not on file  Social Needs  . Financial resource strain: Not hard at all  . Food insecurity:    Worry: Never true    Inability: Never true  . Transportation needs:    Medical: No    Non-medical: No  Tobacco Use  . Smoking status: Current Some Day Smoker  . Smokeless tobacco: Never Used  . Tobacco comment: occasional  Substance and Sexual Activity  . Alcohol use: Not Currently  . Drug use: Not Currently  . Sexual activity: Yes  Lifestyle  . Physical activity:    Days per week: 4 days    Minutes per session: 60 min  . Stress: To some extent  Relationships  . Social connections:    Talks on phone: Three times a week    Gets together: Three times a week    Attends religious service: Never    Active member of club or organization: No    Attends meetings of clubs or  organizations: Never    Relationship status: Not on file  . Intimate partner violence:    Fear of current or ex partner: No    Emotionally abused: No    Physically abused: No    Forced sexual activity: No  Other Topics Concern  . Not on file  Social History Narrative  . Not on file    ROS Review of Systems  Constitutional: Negative for activity change, diaphoresis, fatigue and fever.  Respiratory: Negative for cough, chest tightness, shortness of breath and wheezing.   Cardiovascular: Negative for chest pain, palpitations and leg swelling.  Gastrointestinal: Negative for abdominal distention, abdominal pain, constipation, diarrhea, nausea and vomiting.  Endocrine: Negative for cold  intolerance, heat intolerance, polydipsia, polyphagia and polyuria.  Musculoskeletal: Positive for arthralgias.  Skin: Negative.   Neurological: Negative for dizziness, syncope, weakness, light-headedness, numbness and headaches.  Psychiatric/Behavioral: Negative.     Objective:   Today's Vitals: BP 121/82   Pulse 73   Temp 98.5 F (36.9 C) (Oral)   Ht 5' 6.5" (1.689 m)   Wt 182 lb (82.6 kg)   SpO2 97%   BMI 28.94 kg/m   Physical Exam Vitals signs and nursing note reviewed.  Constitutional:      Appearance: He is well-developed.  HENT:     Head: Normocephalic and atraumatic.     Right Ear: Hearing normal. No drainage.     Left Ear: Hearing normal. No drainage.     Mouth/Throat:     Pharynx: Uvula midline.  Eyes:     General: Lids are normal.        Right eye: No discharge.        Left eye: No discharge.     Conjunctiva/sclera: Conjunctivae normal.     Pupils: Pupils are equal, round, and reactive to light.  Neck:     Musculoskeletal: Normal range of motion and neck supple.     Thyroid: No thyromegaly.     Vascular: No carotid bruit or JVD.     Trachea: Trachea normal.  Cardiovascular:     Rate and Rhythm: Normal rate and regular rhythm.     Heart sounds: Normal heart sounds, S1 normal and S2 normal. No murmur. No gallop.   Pulmonary:     Effort: Pulmonary effort is normal.     Breath sounds: Normal breath sounds.  Abdominal:     General: Bowel sounds are normal.     Palpations: Abdomen is soft. There is no hepatomegaly or splenomegaly.  Musculoskeletal: Normal range of motion.     Right shoulder: He exhibits pain. He exhibits normal range of motion, no tenderness, no swelling, no crepitus and normal strength.     Left shoulder: He exhibits pain. He exhibits normal range of motion, no tenderness, no swelling, no crepitus and normal strength.     Right lower leg: No edema.     Left lower leg: No edema.     Comments: Negative empty can.  Negative Hawkins. Tenderness  with flexion and hyperextension bilateral shoulders.  No pain with abduction, adduction, or rotation.    Skin:    General: Skin is warm and dry.     Capillary Refill: Capillary refill takes less than 2 seconds.     Findings: No rash.  Neurological:     Mental Status: He is alert and oriented to person, place, and time.     Deep Tendon Reflexes: Reflexes are normal and symmetric.  Psychiatric:        Mood  and Affect: Mood normal.        Behavior: Behavior normal.        Thought Content: Thought content normal.        Judgment: Judgment normal.     Assessment & Plan:   Problem List Items Addressed This Visit      Cardiovascular and Mediastinum   Essential hypertension    BP below goal today.  No medications at this time.  Continue to monitor.  Labs next visit.        Other   Hyperlipidemia    Discussed statin therapy due to ASCVD 9.1%.  He wishes to read further about medication and focus on diet.  Will recheck lipids at next visit.  Recommend low cholesterol diet.      Chronic pain of both shoulders - Primary    Ongoing for 2 years per patient report.  Has tried all simple treatments per his report.  Refuses PT referral.  Would like ortho referral for further evaluation as reports his left shoulder has never been pain-free since surgery many years ago.  Referral placed.  Script for Flexeril to help with HS pain and sleep.  Educated him to only use this at night and not to take if driving or with alcohol.      Relevant Medications   cyclobenzaprine (FLEXERIL) 10 MG tablet   Other Relevant Orders   Ambulatory referral to Orthopedics      Outpatient Encounter Medications as of 01/23/2019  Medication Sig  . cyclobenzaprine (FLEXERIL) 10 MG tablet Take 1 tablet (10 mg total) by mouth 3 (three) times daily as needed for muscle spasms.  . [DISCONTINUED] clotrimazole-betamethasone (LOTRISONE) cream Apply 1 application topically 2 (two) times daily.  . [DISCONTINUED] meloxicam  (MOBIC) 15 MG tablet Take 1 tablet (15 mg total) by mouth daily.  . [DISCONTINUED] terbinafine (LAMISIL) 250 MG tablet Take 1 tablet (250 mg total) by mouth daily.  . [DISCONTINUED] betamethasone acetate-betamethasone sodium phosphate (CELESTONE) injection 12 mg   . [DISCONTINUED] betamethasone acetate-betamethasone sodium phosphate (CELESTONE) injection 3 mg    No facility-administered encounter medications on file as of 01/23/2019.     Follow-up: Return in about 2 weeks (around 02/06/2019) for Physical.   Marjie Skiff, NP

## 2019-01-23 NOTE — Assessment & Plan Note (Signed)
Ongoing for 2 years per patient report.  Has tried all simple treatments per his report.  Refuses PT referral.  Would like ortho referral for further evaluation as reports his left shoulder has never been pain-free since surgery many years ago.  Referral placed.  Script for Flexeril to help with HS pain and sleep.  Educated him to only use this at night and not to take if driving or with alcohol.

## 2019-01-23 NOTE — Assessment & Plan Note (Signed)
Discussed statin therapy due to ASCVD 9.1%.  He wishes to read further about medication and focus on diet.  Will recheck lipids at next visit.  Recommend low cholesterol diet.

## 2019-01-23 NOTE — Patient Instructions (Addendum)
Your LDL is above normal.  The LDL is the bad cholesterol.  Over time and in combination with inflammation and other factors, this contributes to plaque which in turn may lead to stroke and/or heart attack down the road.  Sometimes high LDL is primarily genetic, and people might be eating all the right foods but still have high numbers.  Other times, there is room for improvement in one's diet and eating healthier can bring this number down and potentially reduce one's risk of heart attack and/or stroke.  To reduce your LDL, Remember - more fruits and vegetables, more fish, and limitnred meat and dairy products.  More soy, nuts, beans, barley, lentils, oats and plant sterol ester enriched margarine instead of butter.  I also encourage eliminating sugar and processed food.  Remember, shop on the outside of the grocery store and visit your [employer].   If you would like to talk with me about dietary changes plus or minus medications for your cholesterol, please let me know.   Preventing High Cholesterol Cholesterol is a waxy, fat-like substance that your body needs in small amounts. Your liver makes all the cholesterol that your body needs. Having high cholesterol (hypercholesterolemia) increases your risk for heart disease and stroke. Extra (excess) cholesterol comes from the food you eat, such as animal-based fat (saturated fat) from meat and some dairy products. High cholesterol can often be prevented with diet and lifestyle changes. If you already have high cholesterol, you can control it with diet and lifestyle changes, as well as medicine. What nutrition changes can be made?  Eat less saturated fat. Foods that contain saturated fat include red meat and some dairy products.  Avoid processed meats, like bacon and lunch meats.  Avoid trans fats, which are found in margarine and some baked goods.  Avoid foods and beverages that have added sugars.  Eat more fruits, vegetables, and whole  grains.  Choose healthy sources of protein, such as fish, poultry, and nuts.  Choose healthy sources of fat, such as: ? Nuts. ? Vegetable oils, especially olive oil. ? Fish that have healthy fats (omega-3 fatty acids), such as mackerel or salmon. What lifestyle changes can be made?   Lose weight if you are overweight. Losing 5-10 lb (2.3-4.5 kg) can help prevent or control high cholesterol and reduce your risk for diabetes and high blood pressure. Ask your health care provider to help you with a diet and exercise plan to safely lose weight.  Get enough exercise. Do at least 150 minutes of moderate-intensity exercise each week. ? You could do this in short exercise sessions several times a day, or you could do longer exercise sessions a few times a week. For example, you could take a brisk 10-minute walk or bike ride, 3 times a day, for 5 days a week.  Do not smoke. If you need help quitting, ask your health care provider.  Limit your alcohol intake. If you drink alcohol, limit alcohol intake to no more than 1 drink a day for nonpregnant women and 2 drinks a day for men. One drink equals 12 oz of beer, 5 oz of wine, or 1 oz of hard liquor. Why are these changes important?  If you have high cholesterol, deposits (plaques) may build up on the walls of your blood vessels. Plaques make the arteries narrower and stiffer, which can restrict or block blood flow and cause blood clots to form. This greatly increases your risk for heart attack and stroke. Making diet  and lifestyle changes can reduce your risk for these life-threatening conditions. What can I do to lower my risk?  Manage your risk factors for high cholesterol. Talk with your health care provider about all of your risk factors and how to lower your risk.  Manage other conditions that you have, such as diabetes or high blood pressure (hypertension).  Have your cholesterol checked at regular intervals.  Keep all follow-up visits as  told by your health care provider. This is important. How is this treated? In addition to diet and lifestyle changes, your health care provider may recommend medicines to help lower cholesterol, such as a medicine to reduce the amount of cholesterol made in your liver. You may need medicine if:  Diet and lifestyle changes do not lower your cholesterol enough.  You have high cholesterol and other risk factors for heart disease or stroke. Take over-the-counter and prescription medicines only as told by your health care provider. Where to find more information  American Heart Association: 1122334455.jsp  National Heart, Lung, and Blood Institute: http://hood.com/ Summary  High cholesterol increases your risk for heart disease and stroke. By keeping your cholesterol level low, you can reduce your risk for these conditions.  Diet and lifestyle changes are the most important steps in preventing high cholesterol.  Work with your health care provider to manage your risk factors, and have your blood tested regularly. This information is not intended to replace advice given to you by your health care provider. Make sure you discuss any questions you have with your health care provider. Document Released: 12/27/2015 Document Revised: 08/20/2016 Document Reviewed: 08/20/2016 Elsevier Interactive Patient Education  2019 Elsevier Inc.  Atorvastatin tablets What is this medicine? ATORVASTATIN (a TORE va sta tin) is known as a HMG-CoA reductase inhibitor or 'statin'. It lowers the level of cholesterol and triglycerides in the blood. This drug may also reduce the risk of heart attack, stroke, or other health problems in patients with risk factors for heart disease. Diet and lifestyle changes are often used with this drug. This medicine may be used for other purposes; ask your  health care provider or pharmacist if you have questions. COMMON BRAND NAME(S): Lipitor What should I tell my health care provider before I take this medicine? They need to know if you have any of these conditions: -diabetes -if you often drink alcohol -history of stroke -kidney disease -liver disease -muscle aches or weakness -thyroid disease -an unusual or allergic reaction to atorvastatin, other medicines, foods, dyes, or preservatives -pregnant or trying to get pregnant -breast-feeding How should I use this medicine? Take this medicine by mouth with a glass of water. Follow the directions on the prescription label. You can take it with or without food. If it upsets your stomach, take it with food. Do not take with grapefruit juice. Take your medicine at regular intervals. Do not take it more often than directed. Do not stop taking except on your doctor's advice. Talk to your pediatrician regarding the use of this medicine in children. While this drug may be prescribed for children as young as 10 for selected conditions, precautions do apply. Overdosage: If you think you have taken too much of this medicine contact a poison control center or emergency room at once. NOTE: This medicine is only for you. Do not share this medicine with others. What if I miss a dose? If you miss a dose, take it as soon as you can. If your next dose is to be taken in  less than 12 hours, then do not take the missed dose. Take the next dose at your regular time. Do not take double or extra doses. What may interact with this medicine? Do not take this medicine with any of the following medications: -dasabuvir; ombitasvir; paritaprevir; ritonavir -ombitasvir; paritaprevir; ritonavir -posaconazole -red yeast rice This medicine may also interact with the following medications: -alcohol -birth control pills -certain antibiotics like erythromycin and clarithromycin -certain antivirals for HIV or  hepatitis -certain medicines for cholesterol like fenofibrate, gemfibrozil, and niacin -certain medicines for fungal infections like ketoconazole and itraconazole -colchicine -cyclosporine -digoxin -grapefruit juice -rifampin This list may not describe all possible interactions. Give your health care provider a list of all the medicines, herbs, non-prescription drugs, or dietary supplements you use. Also tell them if you smoke, drink alcohol, or use illegal drugs. Some items may interact with your medicine. What should I watch for while using this medicine? Visit your doctor or health care professional for regular check-ups. You may need regular tests to make sure your liver is working properly. Your health care professional may tell you to stop taking this medicine if you develop muscle problems. If your muscle problems do not go away after stopping this medicine, contact your health care professional. Do not become pregnant while taking this medicine. Women should inform their health care professional if they wish to become pregnant or think they might be pregnant. There is a potential for serious side effects to an unborn child. Talk to your health care professional or pharmacist for more information. Do not breast-feed an infant while taking this medicine. This medicine may affect blood sugar levels. If you have diabetes, check with your doctor or health care professional before you change your diet or the dose of your diabetic medicine. If you are going to need surgery or other procedure, tell your doctor that you are using this medicine. This drug is only part of a total heart-health program. Your doctor or a dietician can suggest a low-cholesterol and low-fat diet to help. Avoid alcohol and smoking, and keep a proper exercise schedule. This medicine may cause a decrease in Co-Enzyme Q-10. You should make sure that you get enough Co-Enzyme Q-10 while you are taking this medicine. Discuss the  foods you eat and the vitamins you take with your health care professional. What side effects may I notice from receiving this medicine? Side effects that you should report to your doctor or health care professional as soon as possible: -allergic reactions like skin rash, itching or hives, swelling of the face, lips, or tongue -fever -joint pain -loss of memory -redness, blistering, peeling or loosening of the skin, including inside the mouth -signs and symptoms of liver injury like dark yellow or brown urine; general ill feeling or flu-like symptoms; light-belly pain; unusually weak or tired; yellowing of the eyes or skin -signs and symptoms of muscle injury like dark urine; trouble passing urine or change in the amount of urine; unusually weak or tired; muscle pain or side or back pain Side effects that usually do not require medical attention (report to your doctor or health care professional if they continue or are bothersome): -diarrhea -nausea -stomach pain -trouble sleeping -upset stomach This list may not describe all possible side effects. Call your doctor for medical advice about side effects. You may report side effects to FDA at 1-800-FDA-1088. Where should I keep my medicine? Keep out of the reach of children. Store between 20 and 25 degrees C (68 and  77 degrees F). Throw away any unused medicine after the expiration date. NOTE: This sheet is a summary. It may not cover all possible information. If you have questions about this medicine, talk to your doctor, pharmacist, or health care provider.  2019 Elsevier/Gold Standard (2018-04-13 11:36:48)

## 2019-01-23 NOTE — Assessment & Plan Note (Signed)
BP below goal today.  No medications at this time.  Continue to monitor.  Labs next visit.

## 2019-01-30 DIAGNOSIS — M9903 Segmental and somatic dysfunction of lumbar region: Secondary | ICD-10-CM | POA: Diagnosis not present

## 2019-01-30 DIAGNOSIS — M9902 Segmental and somatic dysfunction of thoracic region: Secondary | ICD-10-CM | POA: Diagnosis not present

## 2019-01-30 DIAGNOSIS — M545 Low back pain: Secondary | ICD-10-CM | POA: Diagnosis not present

## 2019-01-30 DIAGNOSIS — M546 Pain in thoracic spine: Secondary | ICD-10-CM | POA: Diagnosis not present

## 2019-02-01 DIAGNOSIS — M545 Low back pain: Secondary | ICD-10-CM | POA: Diagnosis not present

## 2019-02-01 DIAGNOSIS — M9903 Segmental and somatic dysfunction of lumbar region: Secondary | ICD-10-CM | POA: Diagnosis not present

## 2019-02-01 DIAGNOSIS — M9902 Segmental and somatic dysfunction of thoracic region: Secondary | ICD-10-CM | POA: Diagnosis not present

## 2019-02-01 DIAGNOSIS — M546 Pain in thoracic spine: Secondary | ICD-10-CM | POA: Diagnosis not present

## 2019-02-04 DIAGNOSIS — M7541 Impingement syndrome of right shoulder: Secondary | ICD-10-CM | POA: Diagnosis not present

## 2019-02-04 DIAGNOSIS — M7542 Impingement syndrome of left shoulder: Secondary | ICD-10-CM | POA: Diagnosis not present

## 2019-02-07 ENCOUNTER — Encounter: Payer: BLUE CROSS/BLUE SHIELD | Admitting: Nurse Practitioner

## 2019-02-11 DIAGNOSIS — M9902 Segmental and somatic dysfunction of thoracic region: Secondary | ICD-10-CM | POA: Diagnosis not present

## 2019-02-11 DIAGNOSIS — M9903 Segmental and somatic dysfunction of lumbar region: Secondary | ICD-10-CM | POA: Diagnosis not present

## 2019-02-11 DIAGNOSIS — M545 Low back pain: Secondary | ICD-10-CM | POA: Diagnosis not present

## 2019-02-11 DIAGNOSIS — M546 Pain in thoracic spine: Secondary | ICD-10-CM | POA: Diagnosis not present

## 2019-02-13 ENCOUNTER — Other Ambulatory Visit: Payer: Self-pay

## 2019-02-13 ENCOUNTER — Ambulatory Visit (INDEPENDENT_AMBULATORY_CARE_PROVIDER_SITE_OTHER): Payer: BLUE CROSS/BLUE SHIELD | Admitting: Nurse Practitioner

## 2019-02-13 ENCOUNTER — Encounter: Payer: Self-pay | Admitting: Nurse Practitioner

## 2019-02-13 VITALS — BP 131/82 | HR 65 | Temp 98.0°F | Ht 67.0 in | Wt 184.0 lb

## 2019-02-13 DIAGNOSIS — E782 Mixed hyperlipidemia: Secondary | ICD-10-CM

## 2019-02-13 DIAGNOSIS — G8929 Other chronic pain: Secondary | ICD-10-CM

## 2019-02-13 DIAGNOSIS — F17210 Nicotine dependence, cigarettes, uncomplicated: Secondary | ICD-10-CM | POA: Diagnosis not present

## 2019-02-13 DIAGNOSIS — R51 Headache: Secondary | ICD-10-CM

## 2019-02-13 DIAGNOSIS — R519 Headache, unspecified: Secondary | ICD-10-CM | POA: Insufficient documentation

## 2019-02-13 DIAGNOSIS — I10 Essential (primary) hypertension: Secondary | ICD-10-CM | POA: Diagnosis not present

## 2019-02-13 MED ORDER — ATORVASTATIN CALCIUM 10 MG PO TABS: 10.0000 mg | ORAL_TABLET | Freq: Every day | ORAL | 2 refills | Status: DC

## 2019-02-13 NOTE — Patient Instructions (Addendum)
DASH Eating Plan DASH stands for "Dietary Approaches to Stop Hypertension." The DASH eating plan is a healthy eating plan that has been shown to reduce high blood pressure (hypertension). It may also reduce your risk for type 2 diabetes, heart disease, and stroke. The DASH eating plan may also help with weight loss. What are tips for following this plan?  General guidelines  Avoid eating more than 2,300 mg (milligrams) of salt (sodium) a day. If you have hypertension, you may need to reduce your sodium intake to 1,500 mg a day.  Limit alcohol intake to no more than 1 drink a day for nonpregnant women and 2 drinks a day for men. One drink equals 12 oz of beer, 5 oz of wine, or 1 oz of hard liquor.  Work with your health care provider to maintain a healthy body weight or to lose weight. Ask what an ideal weight is for you.  Get at least 30 minutes of exercise that causes your heart to beat faster (aerobic exercise) most days of the week. Activities may include walking, swimming, or biking.  Work with your health care provider or diet and nutrition specialist (dietitian) to adjust your eating plan to your individual calorie needs. Reading food labels   Check food labels for the amount of sodium per serving. Choose foods with less than 5 percent of the Daily Value of sodium. Generally, foods with less than 300 mg of sodium per serving fit into this eating plan.  To find whole grains, look for the word "whole" as the first word in the ingredient list. Shopping  Buy products labeled as "low-sodium" or "no salt added."  Buy fresh foods. Avoid canned foods and premade or frozen meals. Cooking  Avoid adding salt when cooking. Use salt-free seasonings or herbs instead of table salt or sea salt. Check with your health care provider or pharmacist before using salt substitutes.  Do not fry foods. Cook foods using healthy methods such as baking, boiling, grilling, and broiling instead.  Cook with  heart-healthy oils, such as olive, canola, soybean, or sunflower oil. Meal planning  Eat a balanced diet that includes: ? 5 or more servings of fruits and vegetables each day. At each meal, try to fill half of your plate with fruits and vegetables. ? Up to 6-8 servings of whole grains each day. ? Less than 6 oz of lean meat, poultry, or fish each day. A 3-oz serving of meat is about the same size as a deck of cards. One egg equals 1 oz. ? 2 servings of low-fat dairy each day. ? A serving of nuts, seeds, or beans 5 times each week. ? Heart-healthy fats. Healthy fats called Omega-3 fatty acids are found in foods such as flaxseeds and coldwater fish, like sardines, salmon, and mackerel.  Limit how much you eat of the following: ? Canned or prepackaged foods. ? Food that is high in trans fat, such as fried foods. ? Food that is high in saturated fat, such as fatty meat. ? Sweets, desserts, sugary drinks, and other foods with added sugar. ? Full-fat dairy products.  Do not salt foods before eating.  Try to eat at least 2 vegetarian meals each week.  Eat more home-cooked food and less restaurant, buffet, and fast food.  When eating at a restaurant, ask that your food be prepared with less salt or no salt, if possible. What foods are recommended? The items listed may not be a complete list. Talk with your dietitian about   what dietary choices are best for you. Grains Whole-grain or whole-wheat bread. Whole-grain or whole-wheat pasta. Brown rice. Oatmeal. Quinoa. Bulgur. Whole-grain and low-sodium cereals. Pita bread. Low-fat, low-sodium crackers. Whole-wheat flour tortillas. Vegetables Fresh or frozen vegetables (raw, steamed, roasted, or grilled). Low-sodium or reduced-sodium tomato and vegetable juice. Low-sodium or reduced-sodium tomato sauce and tomato paste. Low-sodium or reduced-sodium canned vegetables. Fruits All fresh, dried, or frozen fruit. Canned fruit in natural juice (without  added sugar). Meat and other protein foods Skinless chicken or turkey. Ground chicken or turkey. Pork with fat trimmed off. Fish and seafood. Egg whites. Dried beans, peas, or lentils. Unsalted nuts, nut butters, and seeds. Unsalted canned beans. Lean cuts of beef with fat trimmed off. Low-sodium, lean deli meat. Dairy Low-fat (1%) or fat-free (skim) milk. Fat-free, low-fat, or reduced-fat cheeses. Nonfat, low-sodium ricotta or cottage cheese. Low-fat or nonfat yogurt. Low-fat, low-sodium cheese. Fats and oils Soft margarine without trans fats. Vegetable oil. Low-fat, reduced-fat, or light mayonnaise and salad dressings (reduced-sodium). Canola, safflower, olive, soybean, and sunflower oils. Avocado. Seasoning and other foods Herbs. Spices. Seasoning mixes without salt. Unsalted popcorn and pretzels. Fat-free sweets. What foods are not recommended? The items listed may not be a complete list. Talk with your dietitian about what dietary choices are best for you. Grains Baked goods made with fat, such as croissants, muffins, or some breads. Dry pasta or rice meal packs. Vegetables Creamed or fried vegetables. Vegetables in a cheese sauce. Regular canned vegetables (not low-sodium or reduced-sodium). Regular canned tomato sauce and paste (not low-sodium or reduced-sodium). Regular tomato and vegetable juice (not low-sodium or reduced-sodium). Pickles. Olives. Fruits Canned fruit in a light or heavy syrup. Fried fruit. Fruit in cream or butter sauce. Meat and other protein foods Fatty cuts of meat. Ribs. Fried meat. Bacon. Sausage. Bologna and other processed lunch meats. Salami. Fatback. Hotdogs. Bratwurst. Salted nuts and seeds. Canned beans with added salt. Canned or smoked fish. Whole eggs or egg yolks. Chicken or turkey with skin. Dairy Whole or 2% milk, cream, and half-and-half. Whole or full-fat cream cheese. Whole-fat or sweetened yogurt. Full-fat cheese. Nondairy creamers. Whipped toppings.  Processed cheese and cheese spreads. Fats and oils Butter. Stick margarine. Lard. Shortening. Ghee. Bacon fat. Tropical oils, such as coconut, palm kernel, or palm oil. Seasoning and other foods Salted popcorn and pretzels. Onion salt, garlic salt, seasoned salt, table salt, and sea salt. Worcestershire sauce. Tartar sauce. Barbecue sauce. Teriyaki sauce. Soy sauce, including reduced-sodium. Steak sauce. Canned and packaged gravies. Fish sauce. Oyster sauce. Cocktail sauce. Horseradish that you find on the shelf. Ketchup. Mustard. Meat flavorings and tenderizers. Bouillon cubes. Hot sauce and Tabasco sauce. Premade or packaged marinades. Premade or packaged taco seasonings. Relishes. Regular salad dressings. Where to find more information:  National Heart, Lung, and Blood Institute: www.nhlbi.nih.gov  American Heart Association: www.heart.org Summary  The DASH eating plan is a healthy eating plan that has been shown to reduce high blood pressure (hypertension). It may also reduce your risk for type 2 diabetes, heart disease, and stroke.  With the DASH eating plan, you should limit salt (sodium) intake to 2,300 mg a day. If you have hypertension, you may need to reduce your sodium intake to 1,500 mg a day.  When on the DASH eating plan, aim to eat more fresh fruits and vegetables, whole grains, lean proteins, low-fat dairy, and heart-healthy fats.  Work with your health care provider or diet and nutrition specialist (dietitian) to adjust your eating plan to your   individual calorie needs. This information is not intended to replace advice given to you by your health care provider. Make sure you discuss any questions you have with your health care provider. Document Released: 12/01/2011 Document Revised: 12/05/2016 Document Reviewed: 12/05/2016 Elsevier Interactive Patient Education  2019 Elsevier Inc.  Cholesterol  Cholesterol is a fat. Your body needs a small amount of cholesterol.  Cholesterol (plaque) may build up in your blood vessels (arteries). That makes you more likely to have a heart attack or stroke. You cannot feel your cholesterol level. Having a blood test is the only way to find out if your level is high. Keep your test results. Work with your doctor to keep your cholesterol at a good level. What do the results mean?  Total cholesterol is how much cholesterol is in your blood.  LDL is bad cholesterol. This is the type that can build up. Try to have low LDL.  HDL is good cholesterol. It cleans your blood vessels and carries LDL away. Try to have high HDL.  Triglycerides are fat that the body can store or burn for energy. What are good levels of cholesterol?  Total cholesterol below 200.  LDL below 100 is good for people who have health risks. LDL below 70 is good for people who have very high risks.  HDL above 40 is good. It is best to have HDL of 60 or higher.  Triglycerides below 150. How can I lower my cholesterol? Diet Follow your diet program as told by your doctor.  Choose fish, white meat chicken, or [place] that is roasted or baked. Try not to eat red meat, fried foods, sausage, or lunch meats.  Eat lots of fresh fruits and vegetables.  Choose whole grains, beans, pasta, potatoes, and cereals.  Choose olive oil, corn oil, or canola oil. Only use small amounts.  Try not to eat butter, mayonnaise, shortening, or palm kernel oils.  Try not to eat foods with trans fats.  Choose low-fat or nonfat dairy foods. ? Drink skim or nonfat milk. ? Eat low-fat or nonfat yogurt and cheeses. ? Try not to drink whole milk or cream. ? Try not to eat ice cream, egg yolks, or full-fat cheeses.  Healthy desserts include angel food cake, ginger snaps, animal crackers, hard candy, popsicles, and low-fat or nonfat frozen yogurt. Try not to eat pastries, cakes, pies, and cookies.  Exercise Follow your exercise program as told by your doctor.  Be more  active. Try gardening, walking, and taking the stairs.  Ask your doctor about ways that you can be more active. Medicine  Take over-the-counter and prescription medicines only as told by your doctor. This information is not intended to replace advice given to you by your health care provider. Make sure you discuss any questions you have with your health care provider. Document Released: 03/10/2009 Document Revised: 07/13/2016 Document Reviewed: 06/23/2016 Elsevier Interactive Patient Education  2019 [employer]  Smoking Tobacco Information, Adult Smoking tobacco can be harmful to your health. Tobacco contains a poisonous (toxic), colorless chemical called nicotine. Nicotine is addictive. It changes the brain and can make it hard to stop smoking. Tobacco also has other toxic chemicals that can hurt your body and raise your risk of many cancers. How can smoking tobacco affect me? Smoking tobacco puts you at risk for:  Cancer. Smoking is most commonly associated with lung cancer, but can also lead to cancer in other parts of the body.  Chronic obstructive pulmonary disease (COPD). This  is a long-term lung condition that makes it hard to breathe. It also gets worse over time.  High blood pressure (hypertension), heart disease, stroke, or heart attack.  Lung infections, such as pneumonia.  Cataracts. This is when the lenses in the eyes become clouded.  Digestive problems. This may include peptic ulcers, heartburn, and gastroesophageal reflux disease (GERD).  Oral health problems, such as gum disease and tooth loss.  Loss of taste and smell. Smoking can affect your appearance by causing:  Wrinkles.  Yellow or stained teeth, fingers, and fingernails. Smoking tobacco can also affect your social life, because:  It may be challenging to find places to smoke when away from home. Many workplaces, [employer], hotels, and public places are tobacco-free.  Smoking is expensive. This is due  to the cost of tobacco and the long-term costs of treating health problems from smoking.  Secondhand smoke may affect those around you. Secondhand smoke can cause lung cancer, breathing problems, and heart disease. Children of smokers have a higher risk for: ? Sudden infant death syndrome (SIDS). ? Ear infections. ? Lung infections. If you currently smoke tobacco, quitting now can help you:  Lead a longer and healthier life.  Look, smell, breathe, and feel better over time.  Save money.  Protect others from the harms of secondhand smoke. What actions can I take to prevent health problems? Quit smoking   Do not start smoking. Quit if you already do.  Make a plan to quit smoking and commit to it. Look for programs to help you and ask your health care provider for recommendations and ideas.  Set a date and write down all the reasons you want to quit.  Let your friends and family know you are quitting so they can help and support you. Consider finding friends who also want to quit. It can be easier to quit with someone else, so that you can support each other.  Talk with your health care provider about using nicotine replacement medicines to help you quit, such as gum, lozenges, patches, sprays, or pills.  Do not replace cigarette smoking with electronic cigarettes, which are commonly called e-cigarettes. The safety of e-cigarettes is not known, and some may contain harmful chemicals.  If you try to quit but return to smoking, stay positive. It is common to slip up when you first quit, so take it one day at a time.  Be prepared for cravings. When you feel the urge to smoke, chew gum or suck on hard candy. Lifestyle  Stay busy and take care of your body.  Drink enough fluid to keep your urine pale yellow.  Get plenty of exercise and eat a healthy diet. This can help prevent weight gain after quitting.  Monitor your eating habits. Quitting smoking can cause you to have a larger  appetite than when you smoke.  Find ways to relax. Go out with friends or family to a movie or a restaurant where people do not smoke.  Ask your health care provider about having regular tests (screenings) to check for cancer. This may include blood tests, imaging tests, and other tests.  Find ways to manage your stress, such as meditation, yoga, or exercise. Where to find support To get support to quit smoking, consider:  Asking your health care provider for more information and resources.  Taking classes to learn more about quitting smoking.  Looking for local organizations that offer resources about quitting smoking.  Joining a support group for people who want to  quit smoking in your local community.  Calling the smokefree.gov counselor helpline: 1-800-Quit-Now (915)786-2181) Where to find more information You may find more information about quitting smoking from:  HelpGuide.org: www.helpguide.org  BankR[employer]: smokefree.gov  American Lung Association: www.lung.org Contact a health care provider if you:  Have problems breathing.  Notice that your lips, nose, or fingers turn blue.  Have chest pain.  Are coughing up blood.  Feel faint or you pass out.  Have other health changes that cause you to worry. Summary  Smoking tobacco can negatively affect your health, the health of those around you, your finances, and your social life.  Do not start smoking. Quit if you already do. If you need help quitting, ask your health care provider.  Think about joining a support group for people who want to quit smoking in your local community. There are many effective programs that will help you to quit this behavior. This information is not intended to replace advice given to you by your health care provider. Make sure you discuss any questions you have with your health care provider. Document Released: 12/27/2016 Document Revised: 01/31/2018 Document Reviewed:  12/27/2016 Elsevier Interactive Patient Education  2019 [employer].

## 2019-02-13 NOTE — Assessment & Plan Note (Addendum)
ASCVD risk of 10.4%, discussed with patient. Diet and medication options reviewed and side effects associated with statins. Will initiate Lipitor 10mg  today- pt agreed to plan. Discussed added benefit of diet, exercise, and smoking cessation. Follow-up in 4 weeks.

## 2019-02-13 NOTE — Progress Notes (Addendum)
BP 131/82   Pulse 65   Temp 98 F (36.7 C) (Oral)   Ht 5\' 7"  (1.702 m)   Wt 184 lb (83.5 kg)   SpO2 100%   BMI 28.82 kg/m    Subjective:    Patient ID: Gavin Chaney, male    DOB: 05/03/1966, 53 y.o.   MRN: [id number]  HPI: Gavin Chaney is a 53 y.o. male  Chief Complaint  Patient presents with  . Hypertension    f/u  . Hyperlipidemia   HYPERTENSION / HYPERLIPIDEMIA Pt not currently taking any medications for BP at this time. Not checking BP at home.  Pt not currently taking any medications for cholesterol at this time.  Duration of hyperlipidemia: months Cholesterol supplements: none Aspirin: no Recent stressors: yes- work Recurrent headaches: yes- nearly every night. Frontal region expanding around the head.  Visual changes: no Palpitations: no Dyspnea: no Chest pain: no Lower extremity edema: no Dizzy/lightheaded: no   The 10-year ASCVD risk score Denman George DC Jr., et al., 2013) is: 10.4%   Values used to calculate the score:     Age: 79 years     Sex: Male     Is Non-Hispanic African American: No     Diabetic: No     Tobacco smoker: Yes     Systolic Blood Pressure: 131 mmHg     Is BP treated: No     HDL Cholesterol: 53 mg/dL     Total Cholesterol: 234 mg/dL  HEADACHE: Pt reports headaches daily at night in a band-like pattern around his head to the frontal region for "a few months". He states that he feels these are stress induced related to his job, difficulty sleeping, and recent injury to his shoulders. He has not tried anything to help these and reports they resolve on their own by morning. He denies changes to vision, dizziness, sensitivity to light or sound, or nausea. Pt reports headaches correlate with spending time on his phone, not sleeping well, and when he has strained to see during the day. Endorses having bifocal glasses, with recent eye exam, but is not wearing them consistently.    Relevant past medical, surgical, family and social  history reviewed and updated as indicated. Interim medical history since our last visit reviewed. Allergies and medications reviewed and updated.  Review of Systems  Constitutional: Negative for activity change, diaphoresis, fatigue and fever.  Respiratory: Negative for cough, chest tightness, shortness of breath and wheezing.   Cardiovascular: Negative for chest pain, palpitations and leg swelling.  Gastrointestinal: Negative for abdominal distention, abdominal pain, constipation, diarrhea, nausea and vomiting.  Endocrine: Negative for cold intolerance, heat intolerance, polydipsia, polyphagia and polyuria.  Musculoskeletal: Positive for arthralgias and myalgias. Negative for neck pain and neck stiffness.       Bilateral shoulder pain being seen by orthopaedics.   Skin: Negative.   Neurological: Positive for headaches. Negative for dizziness, syncope, weakness, light-headedness and numbness.  Psychiatric/Behavioral: Negative.     Per HPI unless specifically indicated above     Objective:    BP 131/82   Pulse 65   Temp 98 F (36.7 C) (Oral)   Ht 5\' 7"  (1.702 m)   Wt 184 lb (83.5 kg)   SpO2 100%   BMI 28.82 kg/m   Wt Readings from Last 3 Encounters:  02/13/19 184 lb (83.5 kg)  01/23/19 182 lb (82.6 kg)  09/27/16 190 lb (86.2 kg)    Physical Exam Vitals signs and nursing note reviewed.  Constitutional:      Appearance: He is well-developed.  HENT:     Head: Normocephalic and atraumatic.     Right Ear: Hearing normal. No drainage.     Left Ear: Hearing normal. No drainage.     Mouth/Throat:     Pharynx: Uvula midline.  Eyes:     General: Lids are normal.        Right eye: No discharge.        Left eye: No discharge.     Conjunctiva/sclera: Conjunctivae normal.     Pupils: Pupils are equal, round, and reactive to light.  Neck:     Musculoskeletal: Normal range of motion and neck supple.     Thyroid: No thyromegaly.     Vascular: No carotid bruit or JVD.     Trachea:  Trachea normal.  Cardiovascular:     Rate and Rhythm: Normal rate and regular rhythm.     Heart sounds: Normal heart sounds, S1 normal and S2 normal. No murmur. No gallop.   Pulmonary:     Effort: Pulmonary effort is normal.     Breath sounds: Normal breath sounds.  Abdominal:     General: Bowel sounds are normal.     Palpations: Abdomen is soft. There is no hepatomegaly or splenomegaly.  Musculoskeletal: Normal range of motion.     Right lower leg: No edema.     Left lower leg: No edema.  Skin:    General: Skin is warm and dry.     Capillary Refill: Capillary refill takes less than 2 seconds.     Findings: No rash.  Neurological:     Mental Status: He is alert and oriented to person, place, and time.     Deep Tendon Reflexes: Reflexes are normal and symmetric.  Psychiatric:        Mood and Affect: Mood normal.        Behavior: Behavior normal.        Thought Content: Thought content normal.        Judgment: Judgment normal.    No results found for this or any previous visit.    Assessment & Plan:   Problem List Items Addressed This Visit      Cardiovascular and Mediastinum   Essential hypertension - Primary    Below goal today.  No current meds.  Recommend DASH diet and exercise regimen.      Relevant Medications   atorvastatin (LIPITOR) 10 MG tablet     Other   Hyperlipidemia    ASCVD risk of 10.4%, discussed with patient. Diet and medication options reviewed and side effects associated with statins. Will initiate Lipitor 10mg  today- pt agreed to plan. Discussed added benefit of diet, exercise, and smoking cessation. Follow-up in 4 weeks.      Relevant Medications   atorvastatin (LIPITOR) 10 MG tablet   Nicotine dependence, cigarettes, uncomplicated    I have recommended complete cessation of tobacco use. I have discussed various options available for assistance with tobacco cessation including over the counter methods (Nicotine gum, patch and lozenges). We also  discussed prescription options (Chantix, Nicotine Inhaler / Nasal Spray). The patient is not interested in pursuing any prescription tobacco cessation options at this time.      Chronic nonintractable headache    Recommended avoiding electronic device use 2 hours prior to sleep, smoking cessation, use of prescribed reading glasses, and relaxation techniques. Recommend Melatonin PRN. Pt to notify provider if headaches worsen or do not improve. Follow-up as  needed.           Follow up plan: Return in about 4 weeks (around 03/13/2019) for HLD f/u and labs.   NOTE WRITTEN BY [employer] DNP STUDENT.  ASSESSMENT AND PLAN OF CARE REVIEWED WITH STUDENT, AGREE WITH ABOVE FINDINGS AND PLAN.

## 2019-02-13 NOTE — Assessment & Plan Note (Addendum)
Recommended avoiding electronic device use 2 hours prior to sleep, smoking cessation, use of prescribed reading glasses, and relaxation techniques. Recommend Melatonin PRN. Pt to notify provider if headaches worsen or do not improve. Follow-up as needed.

## 2019-02-13 NOTE — Assessment & Plan Note (Addendum)
Below goal today.  No current meds.  Recommend DASH diet and exercise regimen. 

## 2019-02-13 NOTE — Assessment & Plan Note (Addendum)
I have recommended complete cessation of tobacco use. I have discussed various options available for assistance with tobacco cessation including over the counter methods (Nicotine gum, patch and lozenges). We also discussed prescription options (Chantix, Nicotine Inhaler / Nasal Spray). The patient is not interested in pursuing any prescription tobacco cessation options at this time.  

## 2019-02-18 DIAGNOSIS — M545 Low back pain: Secondary | ICD-10-CM | POA: Diagnosis not present

## 2019-02-18 DIAGNOSIS — M9902 Segmental and somatic dysfunction of thoracic region: Secondary | ICD-10-CM | POA: Diagnosis not present

## 2019-02-18 DIAGNOSIS — M546 Pain in thoracic spine: Secondary | ICD-10-CM | POA: Diagnosis not present

## 2019-02-18 DIAGNOSIS — M9903 Segmental and somatic dysfunction of lumbar region: Secondary | ICD-10-CM | POA: Diagnosis not present

## 2019-02-28 DIAGNOSIS — M7542 Impingement syndrome of left shoulder: Secondary | ICD-10-CM | POA: Diagnosis not present

## 2019-03-04 DIAGNOSIS — M7542 Impingement syndrome of left shoulder: Secondary | ICD-10-CM | POA: Diagnosis not present

## 2019-06-22 ENCOUNTER — Other Ambulatory Visit: Payer: Self-pay | Admitting: Nurse Practitioner

## 2019-06-22 NOTE — Telephone Encounter (Signed)
Requested medication (s) are due for refill today: yes  Requested medication (s) are on the active medication list: yes  Last refill:  02/13/19   Future visit scheduled: no  Notes to clinic:  No lipid panel noted. Please show t provider to advise   Requested Prescriptions  Pending Prescriptions Disp Refills   atorvastatin (LIPITOR) 10 MG tablet [Pharmacy Med Name: Atorvastatin Calcium 10 MG Oral Tablet] 90 tablet 0    Sig: TAKE 1 TABLET BY MOUTH ONCE DAILY AT  6PM     Cardiovascular:  Antilipid - Statins Failed - 06/22/2019  5:17 PM      Failed - Total Cholesterol in normal range and within 360 days    No results found for: CHOL, POCCHOL       Failed - LDL in normal range and within 360 days    No results found for: LDLCALC, LDLC, HIRISKLDL       Failed - HDL in normal range and within 360 days    No results found for: HDL       Failed - Triglycerides in normal range and within 360 days    No results found for: TRIG       Passed - Patient is not pregnant      Passed - Valid encounter within last 12 months    Recent Outpatient Visits          4 months ago Essential hypertension   [place], [place] T, NP   5 months ago Chronic pain of both shoulders   [facility] [place], Barbaraann Faster, NP

## 2019-07-01 ENCOUNTER — Other Ambulatory Visit: Payer: Self-pay | Admitting: Nurse Practitioner

## 2019-07-01 MED ORDER — ATORVASTATIN CALCIUM 10 MG PO TABS: ORAL_TABLET | ORAL | 0 refills | Status: DC

## 2019-09-22 ENCOUNTER — Other Ambulatory Visit: Payer: Self-pay | Admitting: Nurse Practitioner

## 2019-09-22 NOTE — Telephone Encounter (Signed)
Forwarding medication refill request to PCP for review. 

## 2019-09-23 NOTE — Telephone Encounter (Signed)
Will provide 60 day supply only, he needs follow-up scheduled

## 2020-02-11 ENCOUNTER — Encounter: Payer: Self-pay | Admitting: Nurse Practitioner

## 2020-02-11 ENCOUNTER — Other Ambulatory Visit: Payer: Self-pay

## 2020-02-11 ENCOUNTER — Ambulatory Visit (INDEPENDENT_AMBULATORY_CARE_PROVIDER_SITE_OTHER): Payer: 59 | Admitting: Nurse Practitioner

## 2020-02-11 VITALS — BP 126/81 | HR 61 | Temp 98.3°F | Ht 66.5 in | Wt 184.0 lb

## 2020-02-11 DIAGNOSIS — I10 Essential (primary) hypertension: Secondary | ICD-10-CM

## 2020-02-11 DIAGNOSIS — Z0000 Encounter for general adult medical examination without abnormal findings: Secondary | ICD-10-CM | POA: Diagnosis not present

## 2020-02-11 DIAGNOSIS — E782 Mixed hyperlipidemia: Secondary | ICD-10-CM | POA: Diagnosis not present

## 2020-02-11 DIAGNOSIS — F17210 Nicotine dependence, cigarettes, uncomplicated: Secondary | ICD-10-CM

## 2020-02-11 DIAGNOSIS — G8929 Other chronic pain: Secondary | ICD-10-CM

## 2020-02-11 DIAGNOSIS — M25511 Pain in right shoulder: Secondary | ICD-10-CM

## 2020-02-11 DIAGNOSIS — Z1211 Encounter for screening for malignant neoplasm of colon: Secondary | ICD-10-CM

## 2020-02-11 DIAGNOSIS — Z125 Encounter for screening for malignant neoplasm of prostate: Secondary | ICD-10-CM

## 2020-02-11 MED ORDER — ATORVASTATIN CALCIUM 10 MG PO TABS: 10.0000 mg | ORAL_TABLET | Freq: Every day | ORAL | 3 refills | Status: DC

## 2020-02-11 MED ORDER — PREDNISONE 10 MG PO TABS: ORAL_TABLET | ORAL | 0 refills | Status: DC

## 2020-02-11 NOTE — Progress Notes (Signed)
BP 126/81   Pulse 61   Temp 98.3 F (36.8 C) (Oral)   Ht 5' 6.5" (1.689 m)   Wt 184 lb (83.5 kg)   SpO2 99%   BMI 29.25 kg/m    Subjective:    Patient ID: Gavin Chaney, male    DOB: October 19, 1966, 54 y.o.   MRN: [id number]  HPI: Gavin Chaney is a 54 y.o. male presenting on 02/11/2020 for comprehensive medical examination. Current medical complaints include:none  He currently lives with: wife Interim Problems from his last visit: no   HYPERTENSION / HYPERLIPIDEMIA Not currently taking Atorvastatin, ran out of refills one month ago.  At beginning while taking had heart burn and then after went away.  Continues to smoke occasionally, maybe 3-4 cigarettes a day.  Is trying to quit and working on cutting back.   BP monitoring frequency: not checking BP range:  BP medication side effects: no Duration of hyperlipidemia: chronic Cholesterol medication side effects: no Cholesterol supplements: none Medication compliance: good compliance Aspirin: no Recent stressors: no Recurrent headaches: no Visual changes: no Palpitations: no Dyspnea: no Chest pain: no Lower extremity edema: no Dizzy/lightheaded: no  The 10-year ASCVD risk score Denman George DC Jr., et al., 2013) is: 10.3%   Values used to calculate the score:     Age: 27 years     Sex: Male     Is Non-Hispanic African American: No     Diabetic: No     Tobacco smoker: Yes     Systolic Blood Pressure: 126 mmHg     Is BP treated: No     HDL Cholesterol: 53 mg/dL     Total Cholesterol: 234 mg/dL   SHOULDER PAIN Was seen by orthopedics last year and they talked about cortisone shots, if that did not work then surgery.  Has had ongoing shoulder issues, was given exercises to do.  Was told he inflammation to shoulder.  He is left hand dominant.  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  Works 12 hours day, 3-4 days a week.  Shoulder bothers on work days.  Left shoulder surgery long time ago.  Is having  numbness in right fingers on occasion, is getting better.  Noticed this more while sleeping.  He would prefer not to do surgery as feels this did not help his left shoulder and only made this worse. Duration: months Involved shoulder: right Mechanism of injury: unknown Location: diffuse Onset:gradual Severity: mild Quality: dull and aching Frequency: intermittent Radiation: no Aggravating factors: lifting, movement and sleep  Alleviating factors: APAP and rest  Status: stable Treatments attempted: rest and APAP  Relief with NSAIDs?:  No NSAIDs Taken Weakness: no Numbness: at times to right hand, but improving Decreased grip strength: no Redness: no Swelling: no Bruising: no Fevers: no  Functional Status Survey: Is the patient deaf or have difficulty hearing?: No Does the patient have difficulty seeing, even when wearing glasses/contacts?: No Does the patient have difficulty concentrating, remembering, or making decisions?: Yes Does the patient have difficulty walking or climbing stairs?: No Does the patient have difficulty dressing or bathing?: No Does the patient have difficulty doing errands alone such as visiting a doctor's office or shopping?: No  FALL RISK: No flowsheet data found.  Depression Screen Depression screen [facility] 2/9 02/11/2020 01/23/2019  Decreased Interest 1 2  Down, Depressed, Hopeless 1 1  PHQ - 2 Score 2 3  Altered sleeping 1 3  Tired, decreased energy 1  3  Change in appetite 0 0  Feeling bad or failure about yourself  0 0  Trouble concentrating 0 1  Moving slowly or fidgety/restless 0 0  Suicidal thoughts 0 0  PHQ-9 Score 4 10  Difficult doing work/chores Not difficult at all Somewhat difficult    Advanced Directives <no information>  Past Medical History:  Past Medical History:  Diagnosis Date  . Hyperlipidemia     Surgical History:  Past Surgical History:  Procedure Laterality Date  . ROTATOR CUFF REPAIR    . testcles surgery    .  VASECTOMY      Medications:  Current Outpatient Medications on File Prior to Visit  Medication Sig  . cyclobenzaprine (FLEXERIL) 10 MG tablet Take 1 tablet (10 mg total) by mouth 3 (three) times daily as needed for muscle spasms. (Patient not taking: Reported on 02/13/2019)   No current facility-administered medications on file prior to visit.    Allergies:  No Known Allergies  Social History:  Social History   Socioeconomic History  . Marital status: Single    Spouse name: Not on file  . Number of children: Not on file  . Years of education: Not on file  . Highest education level: Not on file  Occupational History  . Not on file  Tobacco Use  . Smoking status: Current Some Day Smoker    Packs/day: 0.25    Types: Cigarettes  . Smokeless tobacco: Never Used  . Tobacco comment: occasional  Substance and Sexual Activity  . Alcohol use: Not Currently  . Drug use: Not Currently  . Sexual activity: Yes  Other Topics Concern  . Not on file  Social History Narrative  . Not on file   Social Determinants of Health   Financial Resource Strain:   . Difficulty of Paying Living Expenses: Not on file  Food Insecurity:   . Worried About Programme researcher, broadcasting/film/video in the Last Year: Not on file  . Ran Out of Food in the Last Year: Not on file  Transportation Needs:   . Lack of Transportation (Medical): Not on file  . Lack of Transportation (Non-Medical): Not on file  Physical Activity:   . Days of Exercise per Week: Not on file  . Minutes of Exercise per Session: Not on file  Stress:   . Feeling of Stress : Not on file  Social Connections:   . Frequency of Communication with Friends and Family: Not on file  . Frequency of Social Gatherings with Friends and Family: Not on file  . Attends Religious Services: Not on file  . Active Member of Clubs or Organizations: Not on file  . Attends Banker Meetings: Not on file  . Marital Status: Not on file  Intimate Partner  Violence:   . Fear of Current or Ex-Partner: Not on file  . Emotionally Abused: Not on file  . Physically Abused: Not on file  . Sexually Abused: Not on file   Social History   Tobacco Use  Smoking Status Current Some Day Smoker  . Packs/day: 0.25  . Types: Cigarettes  Smokeless Tobacco Never Used  Tobacco Comment   occasional   Social History   Substance and Sexual Activity  Alcohol Use Not Currently    Family History:  Family History  Problem Relation Age of Onset  . Hypertension Sister     Past medical history, surgical history, medications, allergies, family history and social history reviewed with patient today and changes made to  appropriate areas of the chart.   Review of Systems - negative All other ROS negative except what is listed above and in the HPI.      Objective:    BP 126/81   Pulse 61   Temp 98.3 F (36.8 C) (Oral)   Ht 5' 6.5" (1.689 m)   Wt 184 lb (83.5 kg)   SpO2 99%   BMI 29.25 kg/m   Wt Readings from Last 3 Encounters:  02/11/20 184 lb (83.5 kg)  02/13/19 184 lb (83.5 kg)  01/23/19 182 lb (82.6 kg)    Physical Exam Vitals and nursing note reviewed.  Constitutional:      General: He is awake. He is not in acute distress.    Appearance: He is well-developed and overweight. He is not ill-appearing.  HENT:     Head: Normocephalic and atraumatic.     Right Ear: Hearing, tympanic membrane, ear canal and external ear normal. No drainage.     Left Ear: Hearing, tympanic membrane, ear canal and external ear normal. No drainage.     Nose: Nose normal.     Mouth/Throat:     Pharynx: Uvula midline.  Eyes:     General: Lids are normal.        Right eye: No discharge.        Left eye: No discharge.     Extraocular Movements: Extraocular movements intact.     Conjunctiva/sclera: Conjunctivae normal.     Pupils: Pupils are equal, round, and reactive to light.     Visual Fields: Right eye visual fields normal and left eye visual fields  normal.  Neck:     Thyroid: No thyromegaly.     Vascular: No carotid bruit or JVD.     Trachea: Trachea normal.  Cardiovascular:     Rate and Rhythm: Normal rate and regular rhythm.     Heart sounds: Normal heart sounds, S1 normal and S2 normal. No murmur. No gallop.   Pulmonary:     Effort: Pulmonary effort is normal. No accessory muscle usage or respiratory distress.     Breath sounds: Normal breath sounds.  Abdominal:     General: Bowel sounds are normal.     Palpations: Abdomen is soft. There is no hepatomegaly or splenomegaly.     Tenderness: There is no abdominal tenderness.  Musculoskeletal:        General: Normal range of motion.     Right shoulder: No swelling, deformity, laceration or tenderness. Normal range of motion. Normal strength.     Left shoulder: No swelling, deformity, laceration or tenderness. Normal range of motion. Normal strength.     Cervical back: Normal range of motion and neck supple.     Right lower leg: No edema.     Left lower leg: No edema.     Comments: No decrease in right shoulder ROM, however tenderness noted with flexion and extension.  No rashes noted.  No decreased strength, normal sensation present.  Lymphadenopathy:     Head:     Right side of head: No submental, submandibular, tonsillar, preauricular or posterior auricular adenopathy.     Left side of head: No submental, submandibular, tonsillar, preauricular or posterior auricular adenopathy.     Cervical: No cervical adenopathy.  Skin:    General: Skin is warm and dry.     Capillary Refill: Capillary refill takes less than 2 seconds.     Findings: No rash.  Neurological:     Mental Status: He is  alert and oriented to person, place, and time.     Cranial Nerves: Cranial nerves are intact.     Gait: Gait is intact.     Deep Tendon Reflexes: Reflexes are normal and symmetric.     Reflex Scores:      Brachioradialis reflexes are 2+ on the right side and 2+ on the left side.      Patellar  reflexes are 2+ on the right side and 2+ on the left side. Psychiatric:        Attention and Perception: Attention normal.        Mood and Affect: Mood normal.        Speech: Speech normal.        Behavior: Behavior normal. Behavior is cooperative.        Thought Content: Thought content normal.        Cognition and Memory: Cognition normal.        Judgment: Judgment normal.     No results found for this or any previous visit.    Assessment & Plan:   Problem List Items Addressed This Visit      Cardiovascular and Mediastinum   Essential hypertension    Below goal today.  No current meds.  Recommend DASH diet and exercise regimen.      Relevant Medications   atorvastatin (LIPITOR) 10 MG tablet   Other Relevant Orders   CBC with Differential/Platelet   TSH     Other   Hyperlipidemia    Chronic, ongoing.  ASCVD risk of 10.3%, continue Atorvastatin daily.  Refills sent.  Discussed added benefit of diet, exercise, and smoking cessation.  Labs today.  Follow-up in 6 months.      Relevant Medications   atorvastatin (LIPITOR) 10 MG tablet   Other Relevant Orders   Comprehensive metabolic panel   Lipid Panel w/o Chol/HDL Ratio   Right shoulder pain    Ongoing, will trial Prednisone taper.  Script sent.  If no benefit will trial steroid injection in office and recommend PT, he wishes to hold off on PT.  Does not wish to have surgery.  Discussed with him in ongoing pain will need to return to ortho for further discussion.  Continue simple treatments at home.  Return in 4 weeks.      Nicotine dependence, cigarettes, uncomplicated    I have recommended complete cessation of tobacco use. I have discussed various options available for assistance with tobacco cessation including over the counter methods (Nicotine gum, patch and lozenges). We also discussed prescription options (Chantix, Nicotine Inhaler / Nasal Spray). The patient is not interested in pursuing any prescription tobacco  cessation options at this time.        Other Visit Diagnoses    Annual physical exam    -  Primary   Obtain annual labs today CBC, CMP, TSH, lipid, PSA   Relevant Orders   PSA   Prostate cancer screening       PSA on labs today.   Relevant Orders   PSA   Colon cancer screening       Cologuard order placed.   Relevant Orders   Cologuard       Discussed aspirin prophylaxis for myocardial infarction prevention and decision was it was not indicated  LABORATORY TESTING:  Health maintenance labs ordered today as discussed above.   The natural history of prostate cancer and ongoing controversy regarding screening and potential treatment outcomes of prostate cancer has been discussed with the patient.  The meaning of a false positive PSA and a false negative PSA has been discussed. He indicates understanding of the limitations of this screening test and wishes  to proceed with screening PSA testing.   IMMUNIZATIONS:   - Tdap: Tetanus vaccination status reviewed: last tetanus booster within 10 years. - Influenza: Refused - Pneumovax: Not applicable - Prevnar: Not applicable - Zostavax vaccine: Refused  SCREENING: - Colonoscopy: Cologuard ordered  Discussed with patient purpose of the colonoscopy is to detect colon cancer at curable precancerous or early stages   - AAA Screening: Not applicable  -Hearing Test: Not applicable  -Spirometry: Not applicable   PATIENT COUNSELING:    Sexuality: Discussed sexually transmitted diseases, partner selection, use of condoms, avoidance of unintended pregnancy  and contraceptive alternatives.   Advised to avoid cigarette smoking.  I discussed with the patient that most people either abstain from alcohol or drink within safe limits (<=14/week and <=4 drinks/occasion for males, <=7/weeks and <= 3 drinks/occasion for females) and that the risk for alcohol disorders and other health effects rises proportionally with the number of drinks per week  and how often a drinker exceeds daily limits.  Discussed cessation/primary prevention of drug use and availability of treatment for abuse.   Diet: Encouraged to adjust caloric intake to maintain  or achieve ideal body weight, to reduce intake of dietary saturated fat and total fat, to limit sodium intake by avoiding high sodium foods and not adding table salt, and to maintain adequate dietary potassium and calcium preferably from fresh fruits, vegetables, and low-fat dairy products.    stressed the importance of regular exercise  Injury prevention: Discussed safety belts, safety helmets, smoke detector, smoking near bedding or upholstery.   Dental health: Discussed importance of regular tooth brushing, flossing, and dental visits.   Follow up plan: NEXT PREVENTATIVE PHYSICAL DUE IN 1 YEAR. Return in about 4 weeks (around 03/10/2020) for Shoulder pain -- may get injection.

## 2020-02-11 NOTE — Assessment & Plan Note (Signed)
I have recommended complete cessation of tobacco use. I have discussed various options available for assistance with tobacco cessation including over the counter methods (Nicotine gum, patch and lozenges). We also discussed prescription options (Chantix, Nicotine Inhaler / Nasal Spray). The patient is not interested in pursuing any prescription tobacco cessation options at this time.  

## 2020-02-11 NOTE — Assessment & Plan Note (Signed)
Ongoing, will trial Prednisone taper.  Script sent.  If no benefit will trial steroid injection in office and recommend PT, he wishes to hold off on PT.  Does not wish to have surgery.  Discussed with him in ongoing pain will need to return to ortho for further discussion.  Continue simple treatments at home.  Return in 4 weeks.

## 2020-02-11 NOTE — Assessment & Plan Note (Signed)
Below goal today.  No current meds.  Recommend DASH diet and exercise regimen.

## 2020-02-11 NOTE — Assessment & Plan Note (Addendum)
Chronic, ongoing.  ASCVD risk of 10.3%, continue Atorvastatin daily.  Refills sent.  Discussed added benefit of diet, exercise, and smoking cessation.  Labs today.  Follow-up in 6 months.

## 2020-02-11 NOTE — Patient Instructions (Signed)
Shoulder Pain Many things can cause shoulder pain, including:  An injury.  Moving the shoulder in the same way again and again (overuse).  Joint pain (arthritis). Pain can come from:  Swelling and irritation (inflammation) of any part of the shoulder.  An injury to the shoulder joint.  An injury to: ? Tissues that connect muscle to bone (tendons). ? Tissues that connect bones to each other (ligaments). ? Bones. Follow these instructions at home: Watch for changes in your symptoms. Let your doctor know about them. Follow these instructions to help with your pain. If you have a sling:  Wear the sling as told by your doctor. Remove it only as told by your doctor.  Loosen the sling if your fingers: ? Tingle. ? Become numb. ? Turn cold and blue.  Keep the sling clean.  If the sling is not waterproof: ? Do not let it get wet. ? Take the sling off when you shower or bathe. Managing pain, stiffness, and swelling   If told, put ice on the painful area: ? Put ice in a plastic bag. ? Place a towel between your skin and the bag. ? Leave the ice on for 20 minutes, 2-3 times a day. Stop putting ice on if it does not help with the pain.  Squeeze a soft ball or a foam pad as much as possible. This prevents swelling in the shoulder. It also helps to strengthen the arm. General instructions  Take over-the-counter and prescription medicines only as told by your doctor.  Keep all follow-up visits as told by your doctor. This is important. Contact a doctor if:  Your pain gets worse.  Medicine does not help your pain.  You have new pain in your arm, hand, or fingers. Get help right away if:  Your arm, hand, or fingers: ? Tingle. ? Are numb. ? Are swollen. ? Are painful. ? Turn white or blue. Summary  Shoulder pain can be caused by many things. These include injury, moving the shoulder in the same away again and again, and joint pain.  Watch for changes in your symptoms.  Let your doctor know about them.  This condition may be treated with a sling, ice, and pain medicine.  Contact your doctor if the pain gets worse or you have new pain. Get help right away if your arm, hand, or fingers tingle or get numb, swollen, or painful.  Keep all follow-up visits as told by your doctor. This is important. This information is not intended to replace advice given to you by your health care provider. Make sure you discuss any questions you have with your health care provider. Document Revised: 06/26/2018 Document Reviewed: 06/26/2018 Elsevier Patient Education  2020 [employer] Inc.  

## 2020-02-12 LAB — CBC WITH DIFFERENTIAL/PLATELET
Basophils Absolute: 0 10*3/uL (ref 0.0–0.2)
Basos: 1 %
EOS (ABSOLUTE): 0.1 10*3/uL (ref 0.0–0.4)
Eos: 1 %
Hematocrit: 49.5 % (ref 37.5–51.0)
Hemoglobin: 16.5 g/dL (ref 13.0–17.7)
Immature Grans (Abs): 0 10*3/uL (ref 0.0–0.1)
Immature Granulocytes: 0 %
Lymphocytes Absolute: 1.8 10*3/uL (ref 0.7–3.1)
Lymphs: 29 %
MCH: 28.8 pg (ref 26.6–33.0)
MCHC: 33.3 g/dL (ref 31.5–35.7)
MCV: 87 fL (ref 79–97)
Monocytes Absolute: 0.6 10*3/uL (ref 0.1–0.9)
Monocytes: 9 %
Neutrophils Absolute: 3.7 10*3/uL (ref 1.4–7.0)
Neutrophils: 60 %
Platelets: 260 10*3/uL (ref 150–450)
RBC: 5.72 x10E6/uL (ref 4.14–5.80)
RDW: 13.2 % (ref 11.6–15.4)
WBC: 6.2 10*3/uL (ref 3.4–10.8)

## 2020-02-12 LAB — COMPREHENSIVE METABOLIC PANEL
ALT: 28 IU/L (ref 0–44)
AST: 20 IU/L (ref 0–40)
Albumin/Globulin Ratio: 1.7 (ref 1.2–2.2)
Albumin: 4.5 g/dL (ref 3.8–4.9)
Alkaline Phosphatase: 79 IU/L (ref 39–117)
BUN/Creatinine Ratio: 16 (ref 9–20)
BUN: 12 mg/dL (ref 6–24)
Bilirubin Total: 0.5 mg/dL (ref 0.0–1.2)
CO2: 23 mmol/L (ref 20–29)
Calcium: 9.3 mg/dL (ref 8.7–10.2)
Chloride: 105 mmol/L (ref 96–106)
Creatinine, Ser: 0.77 mg/dL (ref 0.76–1.27)
GFR calc Af Amer: 120 mL/min/{1.73_m2} (ref >59)
GFR calc non Af Amer: 104 mL/min/{1.73_m2} (ref >59)
Globulin, Total: 2.7 g/dL (ref 1.5–4.5)
Glucose: 86 mg/dL (ref 65–99)
Potassium: 4.6 mmol/L (ref 3.5–5.2)
Sodium: 142 mmol/L (ref 134–144)
Total Protein: 7.2 g/dL (ref 6.0–8.5)

## 2020-02-12 LAB — LIPID PANEL W/O CHOL/HDL RATIO
Cholesterol, Total: 247 mg/dL — ABNORMAL HIGH (ref 100–199)
HDL: 63 mg/dL (ref >39)
LDL Chol Calc (NIH): 149 mg/dL — ABNORMAL HIGH (ref 0–99)
Triglycerides: 195 mg/dL — ABNORMAL HIGH (ref 0–149)
VLDL Cholesterol Cal: 35 mg/dL (ref 5–40)

## 2020-02-12 LAB — TSH: TSH: 1.26 u[IU]/mL (ref 0.450–4.500)

## 2020-02-12 LAB — PSA: Prostate Specific Ag, Serum: 0.3 ng/mL (ref 0.0–4.0)

## 2020-02-12 NOTE — Progress Notes (Signed)
Good morning.  Please let Gavin Chaney know all labs have returned and everything looks great, with exception of cholesterol levels.  Want him to make sure he gets back on his Atorvastatin and we will recheck this next visit, also heavily focus on a lower cholesterol diet.  Thank you.  Have a great day!!

## 2020-02-20 LAB — COLOGUARD: Cologuard: NEGATIVE

## 2020-02-25 LAB — COLOGUARD: COLOGUARD: NEGATIVE

## 2020-03-09 ENCOUNTER — Ambulatory Visit: Payer: 59 | Admitting: Nurse Practitioner

## 2021-02-10 ENCOUNTER — Other Ambulatory Visit: Payer: Self-pay | Admitting: Nurse Practitioner

## 2021-02-10 NOTE — Telephone Encounter (Signed)
Patient needs an appointment before refill.  

## 2021-02-10 NOTE — Telephone Encounter (Signed)
Requested medication (s) are due for refill today: Yes  Requested medication (s) are on the active medication list: Yes  Last refill:  02/11/20  Future visit scheduled: No  Notes to clinic:  Unable to refill per protocol, Rx expired.      Requested Prescriptions  Pending Prescriptions Disp Refills   atorvastatin (LIPITOR) 10 MG tablet [Pharmacy Med Name: Atorvastatin Calcium 10 MG Oral Tablet] 90 tablet 0    Sig: TAKE 1 TABLET BY MOUTH ONCE DAILY AT  6  PM      Cardiovascular:  Antilipid - Statins Failed - 02/10/2021  1:41 PM      Failed - Total Cholesterol in normal range and within 360 days    Cholesterol, Total  Date Value Ref Range Status  02/11/2020 247 (H) 100 - 199 mg/dL Final          Failed - LDL in normal range and within 360 days    LDL Chol Calc (NIH)  Date Value Ref Range Status  02/11/2020 149 (H) 0 - 99 mg/dL Final          Failed - HDL in normal range and within 360 days    HDL  Date Value Ref Range Status  02/11/2020 63 >39 mg/dL Final          Failed - Triglycerides in normal range and within 360 days    Triglycerides  Date Value Ref Range Status  02/11/2020 195 (H) 0 - 149 mg/dL Final          Failed - Valid encounter within last 12 months    Recent Outpatient Visits           1 year ago Annual physical exam   [facility] [place], Dorie Rank, NP   1 year ago Essential hypertension   [facility] [place], [place] T, NP   2 years ago Chronic pain of both shoulders   [facility] [place], Dorie Rank, NP                Passed - Patient is not pregnant

## 2021-02-11 NOTE — Telephone Encounter (Signed)
Unable to lvm to make apt °

## 2021-02-15 NOTE — Telephone Encounter (Signed)
LVM TO MAKE APT 

## 2021-02-16 NOTE — Telephone Encounter (Signed)
LVM TO MAKE APT 

## 2021-02-17 ENCOUNTER — Encounter: Payer: Self-pay | Admitting: Nurse Practitioner

## 2021-02-17 ENCOUNTER — Other Ambulatory Visit: Payer: Self-pay

## 2021-02-17 ENCOUNTER — Ambulatory Visit (INDEPENDENT_AMBULATORY_CARE_PROVIDER_SITE_OTHER): Payer: 59 | Admitting: Nurse Practitioner

## 2021-02-17 VITALS — BP 118/79 | HR 60 | Temp 98.1°F | Ht 68.0 in | Wt 186.4 lb

## 2021-02-17 DIAGNOSIS — F419 Anxiety disorder, unspecified: Secondary | ICD-10-CM | POA: Insufficient documentation

## 2021-02-17 DIAGNOSIS — F418 Other specified anxiety disorders: Secondary | ICD-10-CM | POA: Diagnosis not present

## 2021-02-17 DIAGNOSIS — Z0000 Encounter for general adult medical examination without abnormal findings: Secondary | ICD-10-CM | POA: Diagnosis not present

## 2021-02-17 DIAGNOSIS — E782 Mixed hyperlipidemia: Secondary | ICD-10-CM

## 2021-02-17 DIAGNOSIS — Z125 Encounter for screening for malignant neoplasm of prostate: Secondary | ICD-10-CM | POA: Diagnosis not present

## 2021-02-17 DIAGNOSIS — I10 Essential (primary) hypertension: Secondary | ICD-10-CM

## 2021-02-17 DIAGNOSIS — F17210 Nicotine dependence, cigarettes, uncomplicated: Secondary | ICD-10-CM

## 2021-02-17 DIAGNOSIS — Z1159 Encounter for screening for other viral diseases: Secondary | ICD-10-CM | POA: Diagnosis not present

## 2021-02-17 MED ORDER — CITALOPRAM HYDROBROMIDE 10 MG PO TABS: 10.0000 mg | ORAL_TABLET | Freq: Every day | ORAL | 0 refills | Status: DC

## 2021-02-17 MED ORDER — ATORVASTATIN CALCIUM 10 MG PO TABS: 10.0000 mg | ORAL_TABLET | Freq: Every day | ORAL | 4 refills | Status: DC

## 2021-02-17 NOTE — Assessment & Plan Note (Signed)
Chronic, ongoing.  Continue current medication regimen and adjust as needed.  Lipid panel and CMP today.    

## 2021-02-17 NOTE — Telephone Encounter (Signed)
Unable to done pt scheduled for apt

## 2021-02-17 NOTE — Assessment & Plan Note (Signed)
I have recommended complete cessation of tobacco use. I have discussed various options available for assistance with tobacco cessation including over the counter methods (Nicotine gum, patch and lozenges). We also discussed prescription options (Chantix, Nicotine Inhaler / Nasal Spray). The patient is not interested in pursuing any prescription tobacco cessation options at this time.  

## 2021-02-17 NOTE — Telephone Encounter (Signed)
Can this be filled since patient is scheduled for an appt

## 2021-02-17 NOTE — Assessment & Plan Note (Signed)
New diagnosis, related to work stressors.  Denies SI/HI.  Discussed various options with patient, including continuing his current herbal remedies.  At this time will try Celexa 10 MG daily, script sent.  Educated on medication use and side effects.  Return to office in 6 weeks for follow-up, sooner if worsening mood.

## 2021-02-17 NOTE — Assessment & Plan Note (Signed)
Stable on assessment today.  No current medications.  Recommend she monitor BP at least a few mornings a week at home and document.  DASH diet at home.  Labs today: CMP, CBC, TSH.  Return in 6 months.

## 2021-02-17 NOTE — Patient Instructions (Signed)

## 2021-02-17 NOTE — Progress Notes (Signed)
BP 118/79   Pulse 60   Temp 98.1 F (36.7 C) (Oral)   Ht 5\' 8"  (1.727 m)   Wt 186 lb 6.4 oz (84.6 kg)   SpO2 99%   BMI 28.34 kg/m    Subjective:    Patient ID: Gavin Chaney, male    DOB: [birth year], 55 y.o.   MRN: [id number]  HPI: Gavin Chaney is a 55 y.o. male presenting on 02/17/2021 for comprehensive medical examination. Current medical complaints include:none  He currently lives with: wife Interim Problems from his last visit: no   HYPERTENSION / HYPERLIPIDEMIA Continues on Atorvastatin.  Continues to smoke occasionally, not every day.  Is trying to quit and working on cutting back.   BP monitoring frequency: not checking BP range:  BP medication side effects: no Duration of hyperlipidemia: chronic Cholesterol medication side effects: no Cholesterol supplements: none Medication compliance: good compliance Aspirin: no Recent stressors: no Recurrent headaches: no Visual changes: no Palpitations: no Dyspnea: no Chest pain: no Lower extremity edema: no Dizzy/lightheaded: no  The 10-year ASCVD risk score Denman George(Goff DC Jr., et al., 2013) is: 8.9%   Values used to calculate the score:     Age: [age] years     Sex: Male     Is Non-Hispanic African American: No     Diabetic: No     Tobacco smoker: Yes     Systolic Blood Pressure: 118 mmHg     Is BP treated: No     HDL Cholesterol: 63 mg/dL     Total Cholesterol: 247 mg/dL   ANXIETY/STRESS No current medications, taking herbal supplements. Duration:stable Anxious mood: yes  Excessive worrying: yes Irritability: yes  Sweating: no Nausea: no Palpitations:no Hyperventilation: no Panic attacks: no Agoraphobia: no  Obscessions/compulsions: no Depressed mood: yes [facility] 02/17/2021 02/11/2020 01/23/2019  Decreased Interest 2 1 2   Down, Depressed, Hopeless 3 1 1   PHQ - 2 Score 5 2 3   Altered sleeping 3 1 3   Tired, decreased energy 3 1 3   Change in appetite 3 0 0  Feeling bad or failure  about yourself  2 0 0  Trouble concentrating 2 0 1  Moving slowly or fidgety/restless 1 0 0  Suicidal thoughts 0 0 0  PHQ-9 Score 19 4 10   Difficult doing work/chores Very difficult Not difficult at all Somewhat difficult   Anhedonia: no Weight changes: no Insomnia: yes hard to stay asleep  Hypersomnia: no Fatigue/loss of energy: no Feelings of worthlessness: no Feelings of guilt: no Impaired concentration/indecisiveness: yes Suicidal ideations: no  Crying spells: no Recent Stressors/Life Changes: yes   Relationship problems: no   Family stress: no     Financial stress: no    Job stress: yes    Recent death/loss: no  Functional Status Survey: Is the patient deaf or have difficulty hearing?: No Does the patient have difficulty seeing, even when wearing glasses/contacts?: No Does the patient have difficulty concentrating, remembering, or making decisions?: No Does the patient have difficulty walking or climbing stairs?: No Does the patient have difficulty dressing or bathing?: No Does the patient have difficulty doing errands alone such as visiting a doctor's office or shopping?: No  FALL RISK: No flowsheet data found.  Advanced Directives <no information>  Past Medical History:  Past Medical History:  Diagnosis Date  . Hyperlipidemia     Surgical History:  Past Surgical History:  Procedure Laterality Date  . ROTATOR CUFF REPAIR    . testcles surgery    .  VASECTOMY      Medications:  No current outpatient medications on file prior to visit.   No current facility-administered medications on file prior to visit.    Allergies:  No Known Allergies  Social History:  Social History   Socioeconomic History  . Marital status: Single    Spouse name: Not on file  . Number of children: Not on file  . Years of education: Not on file  . Highest education level: Not on file  Occupational History  . Not on file  Tobacco Use  . Smoking status: Current Some Day  Smoker    Packs/day: 0.25    Types: Cigarettes  . Smokeless tobacco: Never Used  . Tobacco comment: occasional  Vaping Use  . Vaping Use: Never used  Substance and Sexual Activity  . Alcohol use: Not Currently  . Drug use: Not Currently  . Sexual activity: Yes  Other Topics Concern  . Not on file  Social History Narrative  . Not on file   Social Determinants of Health   Financial Resource Strain: Not on file  Food Insecurity: Not on file  Transportation Needs: Not on file  Physical Activity: Not on file  Stress: Not on file  Social Connections: Not on file  Intimate Partner Violence: Not on file   Social History   Tobacco Use  Smoking Status Current Some Day Smoker  . Packs/day: 0.25  . Types: Cigarettes  Smokeless Tobacco Never Used  Tobacco Comment   occasional   Social History   Substance and Sexual Activity  Alcohol Use Not Currently    Family History:  Family History  Problem Relation Age of Onset  . Hypertension Sister     Past medical history, surgical history, medications, allergies, family history and social history reviewed with patient today and changes made to appropriate areas of the chart.   Review of Systems - negative All other ROS negative except what is listed above and in the HPI.      Objective:    BP 118/79   Pulse 60   Temp 98.1 F (36.7 C) (Oral)   Ht  (1.727 m)   Wt 186 lb 6.4 oz (84.6 kg)   SpO2 99%   BMI 28.34 kg/m   Wt Readings from Last 3 Encounters:  02/17/21 186 lb 6.4 oz (84.6 kg)  02/11/20 184 lb (83.5 kg)  02/13/19 184 lb (83.5 kg)    Physical Exam Vitals and nursing note reviewed.  Constitutional:      General: He is awake. He is not in acute distress.    Appearance: He is well-developed and overweight. He is not ill-appearing.  HENT:     Head: Normocephalic and atraumatic.     Right Ear: Hearing, tympanic membrane, ear canal and external ear normal. No drainage.     Left Ear: Hearing, tympanic  membrane, ear canal and external ear normal. No drainage.     Nose: Nose normal.     Mouth/Throat:     Pharynx: Uvula midline.  Eyes:     General: Lids are normal.        Right eye: No discharge.        Left eye: No discharge.     Extraocular Movements: Extraocular movements intact.     Conjunctiva/sclera: Conjunctivae normal.     Pupils: Pupils are equal, round, and reactive to light.     Visual Fields: Right eye visual fields normal and left eye visual fields normal.  Neck:  Thyroid: No thyromegaly.     Vascular: No carotid bruit or JVD.     Trachea: Trachea normal.  Cardiovascular:     Rate and Rhythm: Normal rate and regular rhythm.     Heart sounds: Normal heart sounds, S1 normal and S2 normal. No murmur heard. No gallop.   Pulmonary:     Effort: Pulmonary effort is normal. No accessory muscle usage or respiratory distress.     Breath sounds: Normal breath sounds.  Abdominal:     General: Bowel sounds are normal.     Palpations: Abdomen is soft. There is no hepatomegaly or splenomegaly.     Tenderness: There is no abdominal tenderness.  Musculoskeletal:        General: Normal range of motion.     Cervical back: Normal range of motion and neck supple.     Right lower leg: No edema.     Left lower leg: No edema.  Lymphadenopathy:     Head:     Right side of head: No submental, submandibular, tonsillar, preauricular or posterior auricular adenopathy.     Left side of head: No submental, submandibular, tonsillar, preauricular or posterior auricular adenopathy.     Cervical: No cervical adenopathy.  Skin:    General: Skin is warm and dry.     Capillary Refill: Capillary refill takes less than 2 seconds.     Findings: No rash.  Neurological:     Mental Status: He is alert and oriented to person, place, and time.     Cranial Nerves: Cranial nerves are intact.     Gait: Gait is intact.     Deep Tendon Reflexes: Reflexes are normal and symmetric.     Reflex Scores:       Brachioradialis reflexes are 2+ on the right side and 2+ on the left side.      Patellar reflexes are 2+ on the right side and 2+ on the left side. Psychiatric:        Attention and Perception: Attention normal.        Mood and Affect: Mood normal.        Speech: Speech normal.        Behavior: Behavior normal. Behavior is cooperative.        Thought Content: Thought content normal.        Cognition and Memory: Cognition normal.        Judgment: Judgment normal.    Results for orders placed or performed in visit on 02/26/20  Cologuard  Result Value Ref Range   Cologuard Negative Negative      Assessment & Plan:   Problem List Items Addressed This Visit      Cardiovascular and Mediastinum   Essential hypertension    Stable on assessment today.  No current medications.  Recommend she monitor BP at least a few mornings a week at home and document.  DASH diet at home.  Labs today: CMP, CBC, TSH.  Return in 6 months.       Relevant Medications   atorvastatin (LIPITOR) 10 MG tablet   Other Relevant Orders   CBC with Differential/Platelet   Comprehensive metabolic panel   TSH     Other   Hyperlipidemia - Primary    Chronic, ongoing.  Continue current medication regimen and adjust as needed.  Lipid panel and CMP today.         Relevant Medications   atorvastatin (LIPITOR) 10 MG tablet   Other Relevant Orders   Lipid  Panel w/o Chol/HDL Ratio   Nicotine dependence, cigarettes, uncomplicated    I have recommended complete cessation of tobacco use. I have discussed various options available for assistance with tobacco cessation including over the counter methods (Nicotine gum, patch and lozenges). We also discussed prescription options (Chantix, Nicotine Inhaler / Nasal Spray). The patient is not interested in pursuing any prescription tobacco cessation options at this time.      Situational anxiety    New diagnosis, related to work stressors.  Denies SI/HI.  Discussed various  options with patient, including continuing his current herbal remedies.  At this time will try Celexa 10 MG daily, script sent.  Educated on medication use and side effects.  Return to office in 6 weeks for follow-up, sooner if worsening mood.      Relevant Medications   citalopram (CELEXA) 10 MG tablet    Other Visit Diagnoses    Need for hepatitis C screening test       Hep C screening on labs today.   Relevant Orders   Hepatitis C antibody   Prostate cancer screening       PSA on labs today.   Relevant Orders   PSA   Annual physical exam       Annual physical today with labs CBC, CMP, TSH, lipid, PSA.  Health maintenance reviewed.  Refuses flu vaccine.       Discussed aspirin prophylaxis for myocardial infarction prevention and decision was it was not indicated  LABORATORY TESTING:  Health maintenance labs ordered today as discussed above.   The natural history of prostate cancer and ongoing controversy regarding screening and potential treatment outcomes of prostate cancer has been discussed with the patient. The meaning of a false positive PSA and a false negative PSA has been discussed. He indicates understanding of the limitations of this screening test and wishes  to proceed with screening PSA testing.   IMMUNIZATIONS:   - Tdap: Tetanus vaccination status reviewed: last tetanus booster within 10 years. - Influenza: Refused - Pneumovax: Not applicable - Prevnar: Not applicable - Zostavax vaccine: Refused  - Covid vaccine -- refused  SCREENING: - Colonoscopy: Cologuard up to date Discussed with patient purpose of the colonoscopy is to detect colon cancer at curable precancerous or early stages   - AAA Screening: Not applicable  -Hearing Test: Not applicable  -Spirometry: Not applicable   PATIENT COUNSELING:    Sexuality: Discussed sexually transmitted diseases, partner selection, use of condoms, avoidance of unintended pregnancy  and contraceptive alternatives.    Advised to avoid cigarette smoking.  I discussed with the patient that most people either abstain from alcohol or drink within safe limits (<=14/week and <=4 drinks/occasion for males, <=7/weeks and <= 3 drinks/occasion for females) and that the risk for alcohol disorders and other health effects rises proportionally with the number of drinks per week and how often a drinker exceeds daily limits.  Discussed cessation/primary prevention of drug use and availability of treatment for abuse.   Diet: Encouraged to adjust caloric intake to maintain  or achieve ideal body weight, to reduce intake of dietary saturated fat and total fat, to limit sodium intake by avoiding high sodium foods and not adding table salt, and to maintain adequate dietary potassium and calcium preferably from fresh fruits, vegetables, and low-fat dairy products.    Stressed the importance of regular exercise  Injury prevention: Discussed safety belts, safety helmets, smoke detector, smoking near bedding or upholstery.   Dental health: Discussed importance of regular  tooth brushing, flossing, and dental visits.   Follow up plan: NEXT PREVENTATIVE PHYSICAL DUE IN 1 YEAR. Return in about 6 weeks (around 03/31/2021) for Anxiety.

## 2021-02-18 LAB — LIPID PANEL W/O CHOL/HDL RATIO
Cholesterol, Total: 191 mg/dL (ref 100–199)
HDL: 62 mg/dL (ref >39)
LDL Chol Calc (NIH): 105 mg/dL — ABNORMAL HIGH (ref 0–99)
Triglycerides: 136 mg/dL (ref 0–149)
VLDL Cholesterol Cal: 24 mg/dL (ref 5–40)

## 2021-02-18 LAB — CBC WITH DIFFERENTIAL/PLATELET
Basophils Absolute: 0 10*3/uL (ref 0.0–0.2)
Basos: 1 %
EOS (ABSOLUTE): 0.1 10*3/uL (ref 0.0–0.4)
Eos: 1 %
Hematocrit: 47.8 % (ref 37.5–51.0)
Hemoglobin: 15.9 g/dL (ref 13.0–17.7)
Immature Grans (Abs): 0 10*3/uL (ref 0.0–0.1)
Immature Granulocytes: 1 %
Lymphocytes Absolute: 1.9 10*3/uL (ref 0.7–3.1)
Lymphs: 30 %
MCH: 28.6 pg (ref 26.6–33.0)
MCHC: 33.3 g/dL (ref 31.5–35.7)
MCV: 86 fL (ref 79–97)
Monocytes Absolute: 0.6 10*3/uL (ref 0.1–0.9)
Monocytes: 10 %
Neutrophils Absolute: 3.7 10*3/uL (ref 1.4–7.0)
Neutrophils: 57 %
Platelets: 243 10*3/uL (ref 150–450)
RBC: 5.55 x10E6/uL (ref 4.14–5.80)
RDW: 12.7 % (ref 11.6–15.4)
WBC: 6.3 10*3/uL (ref 3.4–10.8)

## 2021-02-18 LAB — HEPATITIS C ANTIBODY: Hep C Virus Ab: <0.1 s/co ratio (ref 0.0–0.9)

## 2021-02-18 LAB — COMPREHENSIVE METABOLIC PANEL
ALT: 26 IU/L (ref 0–44)
AST: 23 IU/L (ref 0–40)
Albumin/Globulin Ratio: 2 (ref 1.2–2.2)
Albumin: 4.7 g/dL (ref 3.8–4.9)
Alkaline Phosphatase: 85 IU/L (ref 44–121)
BUN/Creatinine Ratio: 13 (ref 9–20)
BUN: 10 mg/dL (ref 6–24)
Bilirubin Total: 0.5 mg/dL (ref 0.0–1.2)
CO2: 22 mmol/L (ref 20–29)
Calcium: 9.2 mg/dL (ref 8.7–10.2)
Chloride: 104 mmol/L (ref 96–106)
Creatinine, Ser: 0.76 mg/dL (ref 0.76–1.27)
GFR calc Af Amer: 120 mL/min/{1.73_m2} (ref >59)
GFR calc non Af Amer: 103 mL/min/{1.73_m2} (ref >59)
Globulin, Total: 2.4 g/dL (ref 1.5–4.5)
Glucose: 83 mg/dL (ref 65–99)
Potassium: 4.5 mmol/L (ref 3.5–5.2)
Sodium: 141 mmol/L (ref 134–144)
Total Protein: 7.1 g/dL (ref 6.0–8.5)

## 2021-02-18 LAB — TSH: TSH: 1.17 u[IU]/mL (ref 0.450–4.500)

## 2021-02-18 LAB — PSA: Prostate Specific Ag, Serum: 0.3 ng/mL (ref 0.0–4.0)

## 2021-02-18 NOTE — Progress Notes (Signed)
Please let Nathaneal know his labs have returned and overall everything is normal with exception of mild elevation in LDL, bad cholesterol, please ensure you restart the Atorvastatin and take every day.  We will recheck next visit and may adjust dose if needed.  Any questions? Keep being awesome!!  Thank you for allowing me to participate in your care. Kindest regards, Karl Erway

## 2021-03-17 ENCOUNTER — Encounter: Payer: Self-pay | Admitting: Nurse Practitioner

## 2021-04-02 ENCOUNTER — Ambulatory Visit: Payer: 59 | Admitting: Nurse Practitioner

## 2021-12-06 ENCOUNTER — Encounter: Payer: Self-pay | Admitting: Nurse Practitioner

## 2021-12-06 ENCOUNTER — Ambulatory Visit (INDEPENDENT_AMBULATORY_CARE_PROVIDER_SITE_OTHER): Payer: 59 | Admitting: Nurse Practitioner

## 2021-12-06 ENCOUNTER — Other Ambulatory Visit: Payer: Self-pay

## 2021-12-06 VITALS — BP 113/77 | HR 56 | Temp 97.9°F | Wt 183.4 lb

## 2021-12-06 DIAGNOSIS — E782 Mixed hyperlipidemia: Secondary | ICD-10-CM | POA: Diagnosis not present

## 2021-12-06 DIAGNOSIS — F418 Other specified anxiety disorders: Secondary | ICD-10-CM

## 2021-12-06 DIAGNOSIS — K219 Gastro-esophageal reflux disease without esophagitis: Secondary | ICD-10-CM

## 2021-12-06 DIAGNOSIS — M722 Plantar fascial fibromatosis: Secondary | ICD-10-CM

## 2021-12-06 DIAGNOSIS — F17210 Nicotine dependence, cigarettes, uncomplicated: Secondary | ICD-10-CM

## 2021-12-06 DIAGNOSIS — I10 Essential (primary) hypertension: Secondary | ICD-10-CM | POA: Diagnosis not present

## 2021-12-06 MED ORDER — ATORVASTATIN CALCIUM 10 MG PO TABS: 10.0000 mg | ORAL_TABLET | Freq: Every day | ORAL | 4 refills | Status: DC

## 2021-12-06 MED ORDER — CITALOPRAM HYDROBROMIDE 10 MG PO TABS: 10.0000 mg | ORAL_TABLET | Freq: Every day | ORAL | 4 refills | Status: DC

## 2021-12-06 MED ORDER — PANTOPRAZOLE SODIUM 20 MG PO TBEC: 20.0000 mg | DELAYED_RELEASE_TABLET | Freq: Every day | ORAL | 4 refills | Status: DC

## 2021-12-06 MED ORDER — MELOXICAM 15 MG PO TABS: 15.0000 mg | ORAL_TABLET | Freq: Every day | ORAL | 0 refills | Status: DC | PRN

## 2021-12-06 NOTE — Assessment & Plan Note (Signed)
Ongoing.  Denies SI/HI.  Continue Celexa 10 MG daily, script sent.  Educated on medication use and side effects.  Return to office in February for physical.

## 2021-12-06 NOTE — Assessment & Plan Note (Signed)
Chronic, ongoing.  Continue current medication regimen and adjust as needed.  Lipid panel and CMP up to date.  Refills sent.

## 2021-12-06 NOTE — Patient Instructions (Addendum)
Take a tennis ball, place hole in it and put water inside, then let freeze in freezer.  After frozen roll your heel on the tennis ball.  May also use a plain lacrosse ball without freezing it and just rolling.  Opciones de alimentos para pacientes adultos con enfermedad de reflujo gastroesofgico Food Choices for Gastroesophageal Reflux Disease, Adult Si tiene enfermedad de reflujo gastroesofgico (ERGE), los alimentos que consume y los hbitos de alimentacin son muy importantes. Elegir los alimentos adecuados puede ayudar a [employer]. Piense en consultar a un experto en alimentacin (nutricionista) para que lo ayude a Associate Professor. Consejos para seguir Consulting civil engineer las etiquetas de los alimentos Elija alimentos que tengan bajo contenido de grasas saturadas. Los alimentos que pueden ayudar con los sntomas incluyen los siguientes: Alimentos que tienen menos del 5 % de los valores diarios (VD) de grasa. Alimentos que tienen 0 gramos de grasas trans. Al cocinar No frer los alimentos. Cocinar la comida al horno, al vapor, a la plancha o en la parrilla. Estos son mtodos que no necesitan mucha grasa para Water quality scientist. Para agregar sabor, trate de consumir hierbas con bajo contenido de picante y [place]. Planificacin de las comidas  Elegir alimentos saludables con bajo contenido de [place], por ejemplo: Frutas y vegetales. Cereales integrales. Productos lcteos con bajo contenido de [place]. Vita Barley, pescado y aves. Haga comidas pequeas frecuentes en lugar de 3 comidas abundantes al da. Coma lentamente, en un lugar donde est relajado. Evite agacharse o recostarse hasta 2 o 3 horas despus de haber comido. Limite los alimentos con alto contenido graso como las carnes grasas o los alimentos fritos. Limite su ingesta de alimentos grasos, como aceites, [place] y [place]. Evite lo siguiente si el mdico se lo indica: Consumir alimentos que le ocasionen sntomas. Pueden ser  distintos para cada persona. Lleve un registro de los alimentos para identificar aquellos que le causen sntomas. Alcohol. Beber mucha cantidad de lquido con las comidas. Comer 2 o 3 horas antes de acostarse. Estilo de vida Mantenga un peso saludable. Pregntele a su mdico cul es el peso saludable para usted. Si necesita perder peso, hable con su mdico para hacerlo de manera segura. Haga actividad fsica durante, al menos, 30 minutos 5 das por semana o ms, o como se lo haya indicado el mdico. Use ropa suelta. No fume ni consuma ningn producto que contenga nicotina o tabaco. Si necesita ayuda para dejar de fumar, consulte al mdico. Duerma con la cabecera de la cama ms elevada que los pies. Use una cua debajo del colchn o bloques debajo del armazn de la cama para Pharmacologist la cabecera de la cama elevada. Mastique chicle sin azcar despus de las comidas. Qu alimentos debo comer? Siga una dieta saludable y bien equilibrada que incluya abundantes frutas, verduras, cereales integrales, productos lcteos descremados, carnes magras, pescado y aves. Cada persona es diferente. Los alimentos que pueden causar sntomas en una persona pueden no causar sntomas en otra persona. Trabaje con el mdico para hallar alimentos que sean seguros para usted. Es posible que los productos que se enumeran ms [place] no constituyan una lista completa de lo que puede comer y Product manager. Pngase en contacto con un experto en alimentos para conocer ms opciones. Qu alimentos debo evitar? Limitar algunos de estos alimentos puede ayudar a Chief Operating Officer los sntomas de [place]. Cada persona es diferente. Consulte a un experto en alimentacin o a su mdico para que lo ayude a Sales promotion account executive,  si los hubiere. Frutas Cualquier fruta que est preparada con grasa agregada. Cualquier fruta que le ocasione sntomas. Para algunas personas, estas pueden incluir, las frutas ctricas como naranja, pomelo,  pia y limn. Verduras Verduras fritas en abundante aceite. Papas fritas. Cualquier verdura que est preparada con grasa agregada. Cualquier verdura que le ocasione sntomas. Para algunas personas, estas pueden incluir tomates y productos con tomate, [place], cebollas y [place], y rbanos picantes. Granos Pasteles o panes sin levadura con grasa agregada. Carnes y [address]as [address] de alto contenido graso como carne grasa de vaca o cerdo, salchichas, costillas, jamn, salchicha, salame y tocino. Carnes o protenas fritas, lo que incluye pescado frito y pollo frito. Frutos secos y Civil engineer, contracting de frutos secos, en grandes cantidades. Lcteos Leche entera y [place] con chocolate. Tami Ribas. Crema. Helado. Queso crema. Batidos con [employer]. Grasas y [employer]. Margarina. Lardo. [place] clarificada. Bebidas Caf y t negro, con o sin cafena. Bebidas con gas. Refrescos. Bebidas energizantes. Jugo de fruta hecho con frutas cidas, como naranja o pomelo. Jugo de tomate. Bebidas alcohlicas. Dulces y postres Chocolate y cacao. Rosquillas. Alios y condimentos Pimienta. Menta y mentol. Sal agregada. Cualquier condimento, hierbas o aderezos que le ocasionen sntomas. Para algunas personas, esto puede incluir curry, salsa picante o aderezos para ensalada a base de vinagre. Es posible que los productos que se enumeran ms arriba no constituyan una lista completa de lo que no debera comer y Product manager. Pngase en contacto con un experto en alimentos para conocer ms opciones. Preguntas para hacerle al [employer] Los cambios en la dieta y en el estilo de vida a menudo son los primeros pasos que se toman para Company secretary los sntomas de [place]. Si los [employer] dieta y el estilo de vida no ayudan, hable con el mdico sobre el uso de medicamentos. Dnde buscar ms informacin Arts development officer for Gastrointestinal Disorders (Fundacin Internacional para los Trastornos Gastrointestinales):  aboutgerd.org Resumen Si tiene [employer], las elecciones de alimentos y el [place] de vida son muy importantes para ayudar a Consulting civil engineer sntomas. Haga comidas pequeas durante Glass blower/designer de 3 comidas abundantes. Coma lentamente y en un lugar donde est relajado. Evite agacharse o recostarse hasta 2 o 3 horas despus de haber comido. Limite los alimentos con alto contenido graso como las carnes grasas o los alimentos fritos. Esta informacin no tiene Theme park manager el consejo del mdico. Asegrese de hacerle al mdico cualquier pregunta que tenga. Document Revised: 07/24/2020 Document Reviewed: 07/24/2020 Elsevier Patient Education  2022 [employer].

## 2021-12-06 NOTE — Assessment & Plan Note (Signed)
Acute for months, history of similar to left.  At this time educated on simple treatments at home, including ice tennis ball and lacrosse ball + rolling with water bottle.  Will send in Meloxicam to take as needed only.  Referral to podiatry placed.

## 2021-12-06 NOTE — Progress Notes (Signed)
BP 113/77   Pulse (!) 56   Temp 97.9 F (36.6 C)   Wt 183 lb 6.4 oz (83.2 kg)   SpO2 99%   BMI 27.89 kg/m    Subjective:    Patient ID: Gavin Chaney, male    DOB: 02-13-1966, 55 y.o.   MRN: [id number]  HPI: Gavin ArmsDaniel R Wickey is a 55 y.o. male  Chief Complaint  Patient presents with   Foot Pain    Patient states this issue with his right foot has been an ongoing issue and states with his job and standing on his feet all day it is getting worse. Patient states he has the pain located in the arch of his foot. Patient states it is a constant pain. Patient states nothing seems to help with the pain. Patient states he would like to discuss next recommended steps.    Gastroesophageal Reflux    Patient states he would like to discuss with provider about recommendations for GERD.    Medication Refill    Patient states he ran out of his prescription Celexa. Patient states he ran out and he didn't know if he was able to get the prescription refilled.    FOOT PAIN Present to right foot which is worsening -- has been present for 6 months.  Stands on feet all the time at work, only on breaks does it hurt.  Has had similar to left foot in past -- had to have shots before.   Duration: months Involved foot: right Mechanism of injury: unknown Location: to anterior heel Onset: gradual  Severity: 10/10 at worst Quality:  sharp, dull, aching, and throbbing Frequency: intermittent Radiation: yes sometimes radiates up to his hip Aggravating factors: weight bearing, walking, and movement  Alleviating factors: rest  Status: worse Treatments attempted:  massage, rest, and ice  Relief with NSAIDs?:  no Weakness with weight bearing or walking: yes Morning stiffness: yes Swelling: no Redness: no Bruising: no Paresthesias / decreased sensation: no  Fevers:no   GERD Has tried everything over the counter, has been present for awhile.  Drinking coffee or juice causes irritation. GERD  control status: uncontrolled Satisfied with current treatment? none Heartburn frequency: every day Previous GERD medications: OTC medications Antacid use frequency:  every day Duration: months Nature: burning sensation Location: epigastric  Heartburn duration: all day Alleviatiating factors:  nothing Aggravating factors: foods and drinks Dysphagia: no Odynophagia:  no Hematemesis: no Blood in stool: no EGD: no   HYPERTENSION / HYPERLIPIDEMIA Continues on Atorvastatin.  Continues to smoke occasionally, not every day.  Is trying to quit and working on cutting back.  Smoking 1/2 PPD. BP monitoring frequency: not checking BP range:  BP medication side effects: no Duration of hyperlipidemia: chronic Cholesterol medication side effects: no Cholesterol supplements: none Medication compliance: good compliance Aspirin: no Recent stressors: no Recurrent headaches: no Visual changes: no Palpitations: no Dyspnea: no Chest pain: no Lower extremity edema: no Dizzy/lightheaded: no  The 10-year ASCVD risk score (Arnett DK, et al., 2019) is: 6.6%   Values used to calculate the score:     Age: [age] years     Sex: Male     Is Non-Hispanic African American: No     Diabetic: No     Tobacco smoker: Yes     Systolic Blood Pressure: 113 mmHg     Is BP treated: No     HDL Cholesterol: 62 mg/dL     Total Cholesterol: 191 mg/dL  ANXIETY/STRESS Was on Celexa  for a period, but ran out of refills due to pharmacy closing.  He would like to restart due to family stressors. Duration: stable Anxious mood: yes  Excessive worrying: yes Irritability: yes  Sweating: no Nausea: no Palpitations:no Hyperventilation: no Panic attacks: no Agoraphobia: no  Obscessions/compulsions: no Depressed mood: yes Depression screen [facility] 2/9 12/06/2021 02/17/2021 02/11/2020 01/23/2019  Decreased Interest 2 2 1 2   Down, Depressed, Hopeless 2 3 1 1   PHQ - 2 Score 4 5 2 3   Altered sleeping 3 3 1 3   Tired, decreased  energy 3 3 1 3   Change in appetite 0 3 0 0  Feeling bad or failure about yourself  0 2 0 0  Trouble concentrating 1 2 0 1  Moving slowly or fidgety/restless 0 1 0 0  Suicidal thoughts 0 0 0 0  PHQ-9 Score 11 19 4 10   Difficult doing work/chores Somewhat difficult Very difficult Not difficult at all Somewhat difficult   Anhedonia: no Weight changes: no Insomnia: yes hard to stay asleep  Hypersomnia: no Fatigue/loss of energy: no Feelings of worthlessness: no Feelings of guilt: no Impaired concentration/indecisiveness: yes Suicidal ideations: no  Crying spells: no Recent Stressors/Life Changes: yes   Relationship problems: no   Family stress: no     Financial stress: no    Job stress: yes    Recent death/loss: no  Relevant past medical, surgical, family and social history reviewed and updated as indicated. Interim medical history since our last visit reviewed. Allergies and medications reviewed and updated.  Review of Systems  Constitutional:  Negative for activity change, diaphoresis, fatigue and fever.  Respiratory:  Negative for cough, chest tightness, shortness of breath and wheezing.   Cardiovascular:  Negative for chest pain, palpitations and leg swelling.  Gastrointestinal: Negative.   Endocrine: Negative.   Musculoskeletal:  Positive for arthralgias.  Skin: Negative.   Neurological: Negative.   Psychiatric/Behavioral: Negative.     Per HPI unless specifically indicated above     Objective:    BP 113/77   Pulse (!) 56   Temp 97.9 F (36.6 C)   Wt 183 lb 6.4 oz (83.2 kg)   SpO2 99%   BMI 27.89 kg/m   Wt Readings from Last 3 Encounters:  12/06/21 183 lb 6.4 oz (83.2 kg)  02/17/21 186 lb 6.4 oz (84.6 kg)  02/11/20 184 lb (83.5 kg)    Physical Exam Vitals and nursing note reviewed.  Constitutional:      Appearance: He is well-developed.  HENT:     Head: Normocephalic and atraumatic.     Right Ear: Hearing normal. No drainage.     Left Ear: Hearing  normal. No drainage.  Eyes:     General: Lids are normal.        Right eye: No discharge.        Left eye: No discharge.     Conjunctiva/sclera: Conjunctivae normal.     Pupils: Pupils are equal, round, and reactive to light.  Neck:     Thyroid: No thyromegaly.     Vascular: No carotid bruit or JVD.     Trachea: Trachea normal.  Cardiovascular:     Rate and Rhythm: Normal rate and regular rhythm.     Pulses:          Dorsalis pedis pulses are 2+ on the right side and 2+ on the left side.       Posterior tibial pulses are 2+ on the right side and 2+ on the  left side.     Heart sounds: Normal heart sounds, S1 normal and S2 normal. No murmur heard.   No gallop.  Pulmonary:     Effort: Pulmonary effort is normal.     Breath sounds: Normal breath sounds.  Abdominal:     General: Bowel sounds are normal.     Palpations: Abdomen is soft. There is no hepatomegaly or splenomegaly.  Musculoskeletal:        General: Normal range of motion.     Cervical back: Normal range of motion and neck supple.     Right lower leg: No edema.     Left lower leg: No edema.     Right foot: Normal range of motion.     Left foot: Normal range of motion.       Feet:  Feet:     Right foot:     Protective Sensation: 10 sites tested.  10 sites sensed.     Skin integrity: Skin integrity normal.     Toenail Condition: Right toenails are normal.     Left foot:     Protective Sensation: 10 sites tested.  10 sites sensed.     Skin integrity: Skin integrity normal.     Toenail Condition: Left toenails are normal.  Skin:    General: Skin is warm and dry.     Capillary Refill: Capillary refill takes less than 2 seconds.     Findings: No rash.  Neurological:     Mental Status: He is alert and oriented to person, place, and time.     Deep Tendon Reflexes: Reflexes are normal and symmetric.  Psychiatric:        Mood and Affect: Mood normal.        Speech: Speech normal.        Behavior: Behavior normal.         Thought Content: Thought content normal.    Results for orders placed or performed in visit on 02/17/21  CBC with Differential/Platelet  Result Value Ref Range   WBC 6.3 3.4 - 10.8 x10E3/uL   RBC 5.55 4.14 - 5.80 x10E6/uL   Hemoglobin 15.9 13.0 - 17.7 g/dL   Hematocrit 47.8 37.5 - 51.0 %   MCV 86 79 - 97 fL   MCH 28.6 26.6 - 33.0 pg   MCHC 33.3 31.5 - 35.7 g/dL   RDW 12.7 11.6 - 15.4 %   Platelets 243 150 - 450 x10E3/uL   Neutrophils 57 Not Estab. %   Lymphs 30 Not Estab. %   Monocytes 10 Not Estab. %   Eos 1 Not Estab. %   Basos 1 Not Estab. %   Neutrophils Absolute 3.7 1.4 - 7.0 x10E3/uL   Lymphocytes Absolute 1.9 0.7 - 3.1 x10E3/uL   Monocytes Absolute 0.6 0.1 - 0.9 x10E3/uL   EOS (ABSOLUTE) 0.1 0.0 - 0.4 x10E3/uL   Basophils Absolute 0.0 0.0 - 0.2 x10E3/uL   Immature Granulocytes 1 Not Estab. %   Immature Grans (Abs) 0.0 0.0 - 0.1 x10E3/uL  Comprehensive metabolic panel  Result Value Ref Range   Glucose 83 65 - 99 mg/dL   BUN 10 6 - 24 mg/dL   Creatinine, Ser 0.76 0.76 - 1.27 mg/dL   GFR calc non Af Amer 103 >59 mL/min/1.73   GFR calc Af Amer 120 >59 mL/min/1.73   BUN/Creatinine Ratio 13 9 - 20   Sodium 141 134 - 144 mmol/L   Potassium 4.5 3.5 - 5.2 mmol/L   Chloride 104 96 -  106 mmol/L   CO2 22 20 - 29 mmol/L   Calcium 9.2 8.7 - 10.2 mg/dL   Total Protein 7.1 6.0 - 8.5 g/dL   Albumin 4.7 3.8 - 4.9 g/dL   Globulin, Total 2.4 1.5 - 4.5 g/dL   Albumin/Globulin Ratio 2.0 1.2 - 2.2   Bilirubin Total 0.5 0.0 - 1.2 mg/dL   Alkaline Phosphatase 85 44 - 121 IU/L   AST 23 0 - 40 IU/L   ALT 26 0 - 44 IU/L  Hepatitis C antibody  Result Value Ref Range   Hep C Virus Ab <0.1 0.0 - 0.9 s/co ratio  Lipid Panel w/o Chol/HDL Ratio  Result Value Ref Range   Cholesterol, Total 191 100 - 199 mg/dL   Triglycerides 102 0 - 149 mg/dL   HDL 62 >72 mg/dL   VLDL Cholesterol Cal 24 5 - 40 mg/dL   LDL Chol Calc (NIH) 536 (H) 0 - 99 mg/dL  TSH  Result Value Ref Range   TSH 1.170  0.450 - 4.500 uIU/mL  PSA  Result Value Ref Range   Prostate Specific Ag, Serum 0.3 0.0 - 4.0 ng/mL      Assessment & Plan:   Problem List Items Addressed This Visit       Cardiovascular and Mediastinum   Essential hypertension - Primary    Stable on assessment today.  No current medications.  Recommend he monitor BP at least a few mornings a week at home and document.  DASH diet at home.  Labs up to date.  Return in 6 months.       Relevant Medications   atorvastatin (LIPITOR) 10 MG tablet     Digestive   GERD without esophagitis    Ongoing for months with poor response to all OTC medications.  Recommend heavy focus on diet changes, provided information on this, + cessation of smoking.  Will trial Protonix 20 MG daily for short period, reduce to discontinue in future.  Return in February for physical.      Relevant Medications   pantoprazole (PROTONIX) 20 MG tablet     Musculoskeletal and Integument   Plantar fasciitis, right    Acute for months, history of similar to left.  At this time educated on simple treatments at home, including ice tennis ball and lacrosse ball + rolling with water bottle.  Will send in Meloxicam to take as needed only.  Referral to podiatry placed.      Relevant Orders   Ambulatory referral to Podiatry     Other   Hyperlipidemia    Chronic, ongoing.  Continue current medication regimen and adjust as needed.  Lipid panel and CMP up to date.  Refills sent.       Relevant Medications   atorvastatin (LIPITOR) 10 MG tablet   Nicotine dependence, cigarettes, uncomplicated    I have recommended complete cessation of tobacco use. I have discussed various options available for assistance with tobacco cessation including over the counter methods (Nicotine gum, patch and lozenges). We also discussed prescription options (Chantix, Nicotine Inhaler / Nasal Spray). The patient is not interested in pursuing any prescription tobacco cessation options at this  time.       Situational anxiety    Ongoing.  Denies SI/HI.  Continue Celexa 10 MG daily, script sent.  Educated on medication use and side effects.  Return to office in February for physical.      Relevant Medications   citalopram (CELEXA) 10 MG tablet  Follow up plan: Return in about 2 months (around 02/18/2022) for Annual physical.

## 2021-12-06 NOTE — Assessment & Plan Note (Addendum)
Stable on assessment today.  No current medications.  Recommend he monitor BP at least a few mornings a week at home and document.  DASH diet at home.  Labs up to date.  Return in 6 months.

## 2021-12-06 NOTE — Assessment & Plan Note (Signed)
Ongoing for months with poor response to all OTC medications.  Recommend heavy focus on diet changes, provided information on this, + cessation of smoking.  Will trial Protonix 20 MG daily for short period, reduce to discontinue in future.  Return in February for physical.

## 2021-12-06 NOTE — Assessment & Plan Note (Signed)
I have recommended complete cessation of tobacco use. I have discussed various options available for assistance with tobacco cessation including over the counter methods (Nicotine gum, patch and lozenges). We also discussed prescription options (Chantix, Nicotine Inhaler / Nasal Spray). The patient is not interested in pursuing any prescription tobacco cessation options at this time.  

## 2021-12-24 ENCOUNTER — Ambulatory Visit (INDEPENDENT_AMBULATORY_CARE_PROVIDER_SITE_OTHER): Payer: 59

## 2021-12-24 ENCOUNTER — Ambulatory Visit: Payer: 59 | Admitting: Podiatry

## 2021-12-24 ENCOUNTER — Ambulatory Visit (INDEPENDENT_AMBULATORY_CARE_PROVIDER_SITE_OTHER): Payer: 59 | Admitting: Podiatry

## 2021-12-24 ENCOUNTER — Encounter: Payer: Self-pay | Admitting: Podiatry

## 2021-12-24 ENCOUNTER — Other Ambulatory Visit: Payer: Self-pay

## 2021-12-24 VITALS — BP 132/87 | HR 58 | Temp 98.0°F | Resp 20

## 2021-12-24 DIAGNOSIS — M722 Plantar fascial fibromatosis: Secondary | ICD-10-CM | POA: Diagnosis not present

## 2021-12-24 NOTE — Progress Notes (Signed)
Dg  

## 2021-12-28 ENCOUNTER — Telehealth: Payer: Self-pay | Admitting: Podiatry

## 2021-12-28 ENCOUNTER — Other Ambulatory Visit: Payer: Self-pay | Admitting: Podiatry

## 2021-12-28 ENCOUNTER — Telehealth: Payer: Self-pay

## 2021-12-28 MED ORDER — METHYLPREDNISOLONE 4 MG PO TBPK: ORAL_TABLET | ORAL | 0 refills | Status: DC

## 2021-12-28 MED ORDER — MELOXICAM 15 MG PO TABS: 15.0000 mg | ORAL_TABLET | Freq: Every day | ORAL | 1 refills | Status: DC

## 2021-12-28 NOTE — Telephone Encounter (Signed)
[facility] [place]Dr. Logan Bores

## 2021-12-28 NOTE — Telephone Encounter (Signed)
Patient came in today. Patient would like [facility] [place]. Pt doesn't want to use [facility].

## 2021-12-28 NOTE — Telephone Encounter (Signed)
Patient called and stated that his prescription was not called in.  Please send in.  He did not leave pharmacy and the note is not in the chart.  Thanks

## 2021-12-28 NOTE — Telephone Encounter (Signed)
Prescription for Medrol Dosepak and meloxicam sent to [facility] on [address].-Dr. Logan Bores

## 2022-01-08 DIAGNOSIS — M722 Plantar fascial fibromatosis: Secondary | ICD-10-CM | POA: Diagnosis not present

## 2022-01-08 MED ORDER — BETAMETHASONE SOD PHOS & ACET 6 (3-3) MG/ML IJ SUSP
3.0000 mg | Freq: Once | INTRAMUSCULAR | Status: AC
  Administered 2022-01-08: 3 mg via INTRA_ARTICULAR

## 2022-01-08 NOTE — Progress Notes (Signed)
° °  Subjective: 56 y.o. male presenting as a reestablish new patient for evaluation of right heel pain this been going on for about 6 months now.  He experiences sharp pain with walking.  He has been taking some meloxicam.  Gradual onset.  He denies a history of injury.  He presents for further treatment and evaluation   Past Medical History:  Diagnosis Date   Hyperlipidemia    Past Surgical History:  Procedure Laterality Date   ROTATOR CUFF REPAIR     testcles surgery     VASECTOMY     No Known Allergies   Objective: Physical Exam General: The patient is alert and oriented x3 in no acute distress.  Dermatology: Skin is warm, dry and supple bilateral lower extremities. Negative for open lesions or macerations bilateral.   Vascular: Dorsalis Pedis and Posterior Tibial pulses palpable bilateral.  Capillary fill time is immediate to all digits.  Neurological: Epicritic and protective threshold intact bilateral.   Musculoskeletal: Tenderness to palpation to the plantar aspect of the right heel along the plantar fascia. All other joints range of motion within normal limits bilateral. Strength 5/5 in all groups bilateral.   Radiographic exam: Normal osseous mineralization. Joint spaces preserved. No fracture/dislocation/boney destruction. No other soft tissue abnormalities or radiopaque foreign bodies.   Assessment: 1. Plantar fasciitis right  Plan of Care:  1. Patient evaluated. Xrays reviewed.   2. Injection of 0.5cc Celestone soluspan injected into the right plantar fascia  3. Rx for Medrol Dose Pack placed 4. Rx for Meloxicam ordered for patient. 5.  OTC power step insoles provided for the patient 6. Instructed patient regarding therapies and modalities at home to alleviate symptoms.  7. Return to clinic in 4 weeks.     Felecia Shelling, DPM [facility]  Dr. Felecia Shelling, DPM    [address] [place], [place]  [address]                Office [phone]  Fax [phone]

## 2022-01-28 ENCOUNTER — Encounter: Payer: Self-pay | Admitting: Podiatry

## 2022-01-28 ENCOUNTER — Ambulatory Visit (INDEPENDENT_AMBULATORY_CARE_PROVIDER_SITE_OTHER): Payer: 59 | Admitting: Podiatry

## 2022-01-28 ENCOUNTER — Other Ambulatory Visit: Payer: Self-pay

## 2022-01-28 DIAGNOSIS — M722 Plantar fascial fibromatosis: Secondary | ICD-10-CM

## 2022-01-28 NOTE — Progress Notes (Signed)
° °  HPI: 56 y.o. male presenting today for follow-up evaluation of plantar fasciitis to the right heel.  Patient states that he feels much better.  The last injection helped significantly.  Patient states that he has not been taking the meloxicam daily as prescribed.  He did complete the Medrol Dosepak.  He also does not wear the OTC power step insoles that were dispensed last visit.  He wears steel toe work boots at work.  Past Medical History:  Diagnosis Date   Hyperlipidemia     Past Surgical History:  Procedure Laterality Date   ROTATOR CUFF REPAIR     testcles surgery     VASECTOMY      No Known Allergies   Physical Exam: General: The patient is alert and oriented x3 in no acute distress.  Dermatology: Skin is warm, dry and supple bilateral lower extremities. Negative for open lesions or macerations.  Vascular: Palpable pedal pulses bilaterally. Capillary refill within normal limits.  Negative for any significant edema or erythema  Neurological: Light touch and protective threshold grossly intact  Musculoskeletal Exam: No pedal deformities noted.  Today there is really no tenderness to palpation along the plantar arch of the right foot or heel  Assessment: 1.  Plantar fasciitis right   Plan of Care:  1. Patient evaluated. 2.  Continue meloxicam daily as needed 3.  I do recommend the OTC power step insoles especially in the steel toed work boots.  However, I recommend that he wears whenever is most comfortable 4.  Return to clinic as needed      Felecia Shelling, DPM [facility]  Dr. Felecia Shelling, DPM    [address] [place], [place] [address]                Office [phone]  Fax [phone]

## 2022-02-13 NOTE — Patient Instructions (Incomplete)

## 2022-02-18 ENCOUNTER — Encounter: Payer: 59 | Admitting: Nurse Practitioner

## 2022-02-18 DIAGNOSIS — N400 Enlarged prostate without lower urinary tract symptoms: Secondary | ICD-10-CM

## 2022-02-18 DIAGNOSIS — F17210 Nicotine dependence, cigarettes, uncomplicated: Secondary | ICD-10-CM

## 2022-02-18 DIAGNOSIS — F418 Other specified anxiety disorders: Secondary | ICD-10-CM

## 2022-02-18 DIAGNOSIS — I10 Essential (primary) hypertension: Secondary | ICD-10-CM

## 2022-02-18 DIAGNOSIS — E782 Mixed hyperlipidemia: Secondary | ICD-10-CM

## 2022-02-18 DIAGNOSIS — K219 Gastro-esophageal reflux disease without esophagitis: Secondary | ICD-10-CM

## 2022-02-18 DIAGNOSIS — Z0000 Encounter for general adult medical examination without abnormal findings: Secondary | ICD-10-CM

## 2022-03-16 ENCOUNTER — Encounter: Payer: 59 | Admitting: Nurse Practitioner

## 2023-01-15 NOTE — Patient Instructions (Incomplete)

## 2023-01-18 ENCOUNTER — Encounter: Payer: 59 | Admitting: Nurse Practitioner

## 2023-02-06 ENCOUNTER — Other Ambulatory Visit: Payer: Self-pay | Admitting: Nurse Practitioner

## 2023-02-06 NOTE — Telephone Encounter (Signed)
Future OV scheduled, will refill.  Requested Prescriptions  Pending Prescriptions Disp Refills   pantoprazole (PROTONIX) 20 MG tablet [Pharmacy Med Name: PANTOPRAZOLE SOD DR 20 MG TAB] 90 tablet 0    Sig: TAKE 1 TABLET BY MOUTH EVERY DAY     Gastroenterology: Proton Pump Inhibitors Failed - 02/06/2023  1:24 AM      Failed - Valid encounter within last 12 months    Recent Outpatient Visits           1 year ago Essential hypertension   [place], Henrine Screws T, NP   1 year ago Mixed hyperlipidemia   [place], Henrine Screws T, NP   2 years ago Annual physical exam   [place], Henrine Screws T, NP   3 years ago Essential hypertension   [place], [place] T, NP   4 years ago Chronic pain of both shoulders   [place], Kiel T, NP       Future Appointments             In 2 weeks [place], [place] T, NP [employer], [facility]             atorvastatin (LIPITOR) 10 MG tablet [Pharmacy Med Name: ATORVASTATIN 10 MG TABLET] 90 tablet 0    Sig: TAKE 1 TABLET (10 MG TOTAL) BY MOUTH DAILY AT 6 PM.     Cardiovascular:  Antilipid - Statins Failed - 02/06/2023  1:24 AM      Failed - Valid encounter within last 12 months    Recent Outpatient Visits           1 year ago Essential hypertension   [place], Henrine Screws T, NP   1 year ago Mixed hyperlipidemia   [place], Henrine Screws T, NP   2 years ago Annual physical exam   [place], Henrine Screws T, NP   3 years ago Essential hypertension   [place], [place] T, NP   4 years ago Chronic pain of both shoulders   [place], [place] T, NP       Future Appointments             In 2 weeks [place], [place] T,  NP [place], [facility]            Failed - Lipid Panel in normal range within the last 12 months    Cholesterol, Total  Date Value Ref Range Status  02/17/2021 191 100 - 199 mg/dL Final   LDL Chol Calc (NIH)  Date Value Ref Range Status  02/17/2021 105 (H) 0 - 99 mg/dL Final   HDL  Date Value Ref Range Status  02/17/2021 62 >39 mg/dL Final   Triglycerides  Date Value Ref Range Status  02/17/2021 136 0 - 149 mg/dL Final         Passed - Patient is not pregnant

## 2023-02-16 ENCOUNTER — Telehealth: Payer: Self-pay

## 2023-02-16 NOTE — Telephone Encounter (Signed)
[facility]work [facility]

## 2023-02-18 NOTE — Patient Instructions (Incomplete)

## 2023-02-20 ENCOUNTER — Ambulatory Visit (INDEPENDENT_AMBULATORY_CARE_PROVIDER_SITE_OTHER): Payer: 59 | Admitting: Nurse Practitioner

## 2023-02-20 ENCOUNTER — Encounter: Payer: Self-pay | Admitting: Nurse Practitioner

## 2023-02-20 VITALS — BP 130/86 | HR 59 | Temp 98.1°F | Ht 67.99 in | Wt 180.0 lb

## 2023-02-20 DIAGNOSIS — I10 Essential (primary) hypertension: Secondary | ICD-10-CM

## 2023-02-20 DIAGNOSIS — E782 Mixed hyperlipidemia: Secondary | ICD-10-CM | POA: Diagnosis not present

## 2023-02-20 DIAGNOSIS — Z0000 Encounter for general adult medical examination without abnormal findings: Secondary | ICD-10-CM | POA: Diagnosis not present

## 2023-02-20 DIAGNOSIS — F17210 Nicotine dependence, cigarettes, uncomplicated: Secondary | ICD-10-CM

## 2023-02-20 DIAGNOSIS — Z114 Encounter for screening for human immunodeficiency virus [HIV]: Secondary | ICD-10-CM | POA: Diagnosis not present

## 2023-02-20 DIAGNOSIS — G4700 Insomnia, unspecified: Secondary | ICD-10-CM | POA: Insufficient documentation

## 2023-02-20 DIAGNOSIS — F418 Other specified anxiety disorders: Secondary | ICD-10-CM

## 2023-02-20 DIAGNOSIS — J309 Allergic rhinitis, unspecified: Secondary | ICD-10-CM | POA: Insufficient documentation

## 2023-02-20 DIAGNOSIS — Z1211 Encounter for screening for malignant neoplasm of colon: Secondary | ICD-10-CM

## 2023-02-20 DIAGNOSIS — K219 Gastro-esophageal reflux disease without esophagitis: Secondary | ICD-10-CM

## 2023-02-20 DIAGNOSIS — F5101 Primary insomnia: Secondary | ICD-10-CM

## 2023-02-20 DIAGNOSIS — J301 Allergic rhinitis due to pollen: Secondary | ICD-10-CM

## 2023-02-20 MED ORDER — FLUTICASONE PROPIONATE 50 MCG/ACT NA SUSP: 2.0000 | Freq: Every day | NASAL | 6 refills | Status: DC

## 2023-02-20 MED ORDER — FLUTICASONE PROPIONATE 50 MCG/ACT NA SUSP: 2.0000 | Freq: Every day | NASAL | 6 refills | Status: AC

## 2023-02-20 MED ORDER — PANTOPRAZOLE SODIUM 20 MG PO TBEC: 20.0000 mg | DELAYED_RELEASE_TABLET | Freq: Every day | ORAL | 4 refills | Status: DC

## 2023-02-20 MED ORDER — TRAZODONE HCL 50 MG PO TABS: 25.0000 mg | ORAL_TABLET | Freq: Every evening | ORAL | 5 refills | Status: DC | PRN

## 2023-02-20 MED ORDER — ATORVASTATIN CALCIUM 10 MG PO TABS: 10.0000 mg | ORAL_TABLET | Freq: Every day | ORAL | 4 refills | Status: DC

## 2023-02-20 NOTE — Assessment & Plan Note (Signed)
With sinus issues -- recommend start Claritin OTC 10 MG as needed + Flonase nasal spray.  If ongoing them return to office and will determine next steps.

## 2023-02-20 NOTE — Progress Notes (Signed)
BP 130/86   Pulse (!) 59   Temp 98.1 F (36.7 C) (Oral)   Ht 5' 7.99" (1.727 m)   Wt 180 lb (81.6 kg)   SpO2 99%   BMI 27.38 kg/m    Subjective:    Patient ID: Gavin Chaney, male    DOB: 05-26-1966, 57 y.o.   MRN: [id number]  HPI: Gavin Chaney is a 57 y.o. male presenting on 02/20/2023 for comprehensive medical examination. Current medical complaints include:none  He currently lives with: wife Interim Problems from his last visit: no   States having occasional nasal issues that cause some SOB, he got some nasal spray from [facility] but this did not help.  When was at dentist had imaging, 6 months ago, and was told he had bad sinus infection.    HYPERTENSION / HYPERLIPIDEMIA Continues on Atorvastatin.  He does continue to smoke <1 PPD, has been working on quitting.   BP monitoring frequency: not checking BP range:  BP medication side effects: no Duration of hyperlipidemia: chronic Cholesterol medication side effects: no Cholesterol supplements: none Medication compliance: good compliance Aspirin: no Recent stressors: no Recurrent headaches: no Visual changes: no Palpitations: no Dyspnea: no Chest pain: no Lower extremity edema: no Dizzy/lightheaded: no  The 10-year ASCVD risk score (Arnett DK, et al., 2019) is: 9%   Values used to calculate the score:     Age: 86 years     Sex: Male     Is Non-Hispanic African American: No     Diabetic: No     Tobacco smoker: Yes     Systolic Blood Pressure: [id number]     Is BP treated: No     HDL Cholesterol: 62 mg/dL     Total Cholesterol: 191 mg/dL   GERD Continues on Protonix 20 MG -- currently takes as needed, varies on how often. GERD control status: stable Satisfied with current treatment? yes Heartburn frequency: none Medication side effects: no  Medication compliance: stable Dysphagia: no Odynophagia:  no Hematemesis: no Blood in stool: no EGD: no   ANXIETY/STRESS Ordered Celexa, he took this for one  week but had nausea and stopped.   Duration: stable Anxious mood: sometimes Excessive worrying: no Irritability: no Sweating: no Nausea: no Palpitations:no Hyperventilation: no Panic attacks: no Agoraphobia: no  Obscessions/compulsions: no Depressed mood: yes, occasional    02/20/2023    9:45 AM 12/06/2021   11:40 AM 02/17/2021   11:07 AM 02/11/2020   10:36 AM 01/23/2019    1:22 PM  Depression screen PHQ 2/9  Decreased Interest '2 2 2 1 2  '$ Down, Depressed, Hopeless '2 2 3 1 1  '$ PHQ - 2 Score '4 4 5 2 3  '$ Altered sleeping '2 3 3 1 3  '$ Tired, decreased energy '2 3 3 1 3  '$ Change in appetite 0 0 3 0 0  Feeling bad or failure about yourself  0 0 2 0 0  Trouble concentrating 0 1 2 0 1  Moving slowly or fidgety/restless 0 0 1 0 0  Suicidal thoughts  0 0 0 0  PHQ-9 Score '8 11 19 4 10  '$ Difficult doing work/chores  Somewhat difficult Very difficult Not difficult at all Somewhat difficult  Anhedonia: no Weight changes: no Insomnia: yes hard to stay asleep -- Melatonin does not help Hypersomnia: no Fatigue/loss of energy: no Feelings of worthlessness: no Feelings of guilt: no Impaired concentration/indecisiveness: no Suicidal ideations: no  Crying spells: no Recent Stressors/Life Changes: yes -- work, does  not like job, has been there 14 years and needs to pay bills   Relationship problems: no   Family stress: no     Financial stress: no    Job stress: yes    Recent death/loss: no    03/16/23    9:46 AM 12/06/2021   11:40 AM 02/11/2020   10:37 AM 01/23/2019    1:22 PM  GAD 7 : Generalized Anxiety Score  Nervous, Anxious, on Edge '3 2 2 3  '$ Control/stop worrying '2 2 2 2  '$ Worry too much - different things '2 2 1 2  '$ Trouble relaxing '2 2 2 3  '$ Restless  2 0 0  Easily annoyed or irritable  2 2 0  Afraid - awful might happen 2 0 0 1  Total GAD 7 Score  '12 9 11  '$ Anxiety Difficulty Somewhat difficult Somewhat difficult Not difficult at all Somewhat difficult   Functional Status  Survey: Is the patient deaf or have difficulty hearing?: No Does the patient have difficulty seeing, even when wearing glasses/contacts?: No Does the patient have difficulty concentrating, remembering, or making decisions?: No Does the patient have difficulty walking or climbing stairs?: No Does the patient have difficulty dressing or bathing?: No Does the patient have difficulty doing errands alone such as visiting a doctor's office or shopping?: No  FALL RISK:    2023-03-16    9:45 AM  [place] in the past year? 0  Number falls in past yr: 0  Injury with Fall? 0  Risk for fall due to : No Fall Risks   Past Medical History:  Past Medical History:  Diagnosis Date   Hyperlipidemia     Surgical History:  Past Surgical History:  Procedure Laterality Date   ROTATOR CUFF REPAIR     testcles surgery     VASECTOMY      Medications:  Current Outpatient Medications on File Prior to Visit  Medication Sig   meloxicam (MOBIC) 15 MG tablet Take 1 tablet (15 mg total) by mouth daily.   No current facility-administered medications on file prior to visit.    Allergies:  No Known Allergies  Social History:  Social History   Socioeconomic History   Marital status: Single    Spouse name: Not on file   Number of children: Not on file   Years of education: Not on file   Highest education level: Not on file  Occupational History   Not on file  Tobacco Use   Smoking status: Some Days    Packs/day: 0.25    Types: Cigarettes   Smokeless tobacco: Never   Tobacco comments:    occasional  Vaping Use   Vaping Use: Never used  Substance and Sexual Activity   Alcohol use: Not Currently   Drug use: Not Currently   Sexual activity: Yes  Other Topics Concern   Not on file  Social History Narrative   Not on file   Social Determinants of Health   Financial Resource Strain: Low Risk  (01/23/2019)   Overall Financial Resource Strain (CARDIA)    Difficulty of Paying Living  Expenses: Not hard at all  Food Insecurity: No Food Insecurity (01/23/2019)   Hunger Vital Sign    Worried About Running Out of Food in the Last Year: Never true    Ran Out of Food in the Last Year: Never true  Transportation Needs: No Transportation Needs (01/23/2019)   PRAPARE - Transportation    Lack of Transportation (  Medical): No    Lack of Transportation (Non-Medical): No  Physical Activity: Sufficiently Active (01/23/2019)   Exercise Vital Sign    Days of Exercise per Week: 4 days    Minutes of Exercise per Session: 60 min  Stress: Stress Concern Present (01/23/2019)   Bruno    Feeling of Stress : To some extent  Social Connections: Unknown (01/23/2019)   Social Connection and Isolation Panel [NHANES]    Frequency of Communication with Friends and Family: Three times a week    Frequency of Social Gatherings with Friends and Family: Three times a week    Attends Religious Services: Never    Active Member of Clubs or Organizations: No    Attends Archivist Meetings: Never    Marital Status: Not on file  Intimate Partner Violence: Not At Risk (01/23/2019)   Humiliation, Afraid, Rape, and Kick questionnaire    Fear of Current or Ex-Partner: No    Emotionally Abused: No    Physically Abused: No    Sexually Abused: No   Social History   Tobacco Use  Smoking Status Some Days   Packs/day: 0.25   Types: Cigarettes  Smokeless Tobacco Never  Tobacco Comments   occasional   Social History   Substance and Sexual Activity  Alcohol Use Not Currently    Family History:  Family History  Problem Relation Age of Onset   Hypertension Sister     Past medical history, surgical history, medications, allergies, family history and social history reviewed with patient today and changes made to appropriate areas of the chart.   Review of Systems - negative All other ROS negative except what is listed above  and in the HPI.      Objective:    BP 130/86   Pulse (!) 59   Temp 98.1 F (36.7 C) (Oral)   Ht 5' 7.99" (1.727 m)   Wt 180 lb (81.6 kg)   SpO2 99%   BMI 27.38 kg/m   Wt Readings from Last 3 Encounters:  02/20/23 180 lb (81.6 kg)  12/06/21 183 lb 6.4 oz (83.2 kg)  02/17/21 186 lb 6.4 oz (84.6 kg)    Physical Exam Vitals and nursing note reviewed.  Constitutional:      General: He is awake. He is not in acute distress.    Appearance: He is well-developed and overweight. He is not ill-appearing.  HENT:     Head: Normocephalic and atraumatic.     Right Ear: Hearing, tympanic membrane, ear canal and external ear normal. No drainage.     Left Ear: Hearing, tympanic membrane, ear canal and external ear normal. No drainage.     Nose: Nose normal.     Mouth/Throat:     Pharynx: Uvula midline.  Eyes:     General: Lids are normal.        Right eye: No discharge.        Left eye: No discharge.     Extraocular Movements: Extraocular movements intact.     Conjunctiva/sclera: Conjunctivae normal.     Pupils: Pupils are equal, round, and reactive to light.     Visual Fields: Right eye visual fields normal and left eye visual fields normal.  Neck:     Thyroid: No thyromegaly.     Vascular: No carotid bruit or JVD.     Trachea: Trachea normal.  Cardiovascular:     Rate and Rhythm: Normal rate and regular rhythm.  Pulses:          Dorsalis pedis pulses are 2+ on the right side and 2+ on the left side.       Posterior tibial pulses are 2+ on the right side and 2+ on the left side.     Heart sounds: Normal heart sounds, S1 normal and S2 normal. No murmur heard.    No gallop.  Pulmonary:     Effort: Pulmonary effort is normal. No accessory muscle usage or respiratory distress.     Breath sounds: Normal breath sounds.  Abdominal:     General: Bowel sounds are normal.     Palpations: Abdomen is soft. There is no hepatomegaly or splenomegaly.     Tenderness: There is no abdominal  tenderness.  Musculoskeletal:        General: Normal range of motion.     Cervical back: Normal range of motion and neck supple.     Right lower leg: No edema.     Left lower leg: No edema.  Feet:     Right foot:     Protective Sensation: 10 sites tested.  10 sites sensed.     Skin integrity: Callus (to bottom of right great toe and small corn present to area) present.     Toenail Condition: Right toenails are normal.     Left foot:     Protective Sensation: 10 sites tested.  10 sites sensed.     Skin integrity: Skin integrity normal.     Toenail Condition: Left toenails are normal.  Lymphadenopathy:     Head:     Right side of head: No submental, submandibular, tonsillar, preauricular or posterior auricular adenopathy.     Left side of head: No submental, submandibular, tonsillar, preauricular or posterior auricular adenopathy.     Cervical: No cervical adenopathy.  Skin:    General: Skin is warm and dry.     Capillary Refill: Capillary refill takes less than 2 seconds.     Findings: No rash.  Neurological:     Mental Status: He is alert and oriented to person, place, and time.     Gait: Gait is intact.     Deep Tendon Reflexes: Reflexes are normal and symmetric.     Reflex Scores:      Brachioradialis reflexes are 2+ on the right side and 2+ on the left side.      Patellar reflexes are 2+ on the right side and 2+ on the left side. Psychiatric:        Attention and Perception: Attention normal.        Mood and Affect: Mood normal.        Speech: Speech normal.        Behavior: Behavior normal. Behavior is cooperative.        Thought Content: Thought content normal.        Cognition and Memory: Cognition normal.        Judgment: Judgment normal.    Results for orders placed or performed in visit on 02/17/21  CBC with Differential/Platelet  Result Value Ref Range   WBC 6.3 3.4 - 10.8 x10E3/uL   RBC 5.55 4.14 - 5.80 x10E6/uL   Hemoglobin 15.9 13.0 - 17.7 g/dL   Hematocrit  47.8 37.5 - 51.0 %   MCV 86 79 - 97 fL   MCH 28.6 26.6 - 33.0 pg   MCHC 33.3 31.5 - 35.7 g/dL   RDW 12.7 11.6 - 15.4 %   Platelets 243  150 - 450 x10E3/uL   Neutrophils 57 Not Estab. %   Lymphs 30 Not Estab. %   Monocytes 10 Not Estab. %   Eos 1 Not Estab. %   Basos 1 Not Estab. %   Neutrophils Absolute 3.7 1.4 - 7.0 x10E3/uL   Lymphocytes Absolute 1.9 0.7 - 3.1 x10E3/uL   Monocytes Absolute 0.6 0.1 - 0.9 x10E3/uL   EOS (ABSOLUTE) 0.1 0.0 - 0.4 x10E3/uL   Basophils Absolute 0.0 0.0 - 0.2 x10E3/uL   Immature Granulocytes 1 Not Estab. %   Immature Grans (Abs) 0.0 0.0 - 0.1 x10E3/uL  Comprehensive metabolic panel  Result Value Ref Range   Glucose 83 65 - 99 mg/dL   BUN 10 6 - 24 mg/dL   Creatinine, Ser 0.76 0.76 - 1.27 mg/dL   GFR calc non Af Amer 103 >59 mL/min/1.73   GFR calc Af Amer 120 >59 mL/min/1.73   BUN/Creatinine Ratio 13 9 - 20   Sodium 141 134 - 144 mmol/L   Potassium 4.5 3.5 - 5.2 mmol/L   Chloride 104 96 - 106 mmol/L   CO2 22 20 - 29 mmol/L   Calcium 9.2 8.7 - 10.2 mg/dL   Total Protein 7.1 6.0 - 8.5 g/dL   Albumin 4.7 3.8 - 4.9 g/dL   Globulin, Total 2.4 1.5 - 4.5 g/dL   Albumin/Globulin Ratio 2.0 1.2 - 2.2   Bilirubin Total 0.5 0.0 - 1.2 mg/dL   Alkaline Phosphatase 85 44 - 121 IU/L   AST 23 0 - 40 IU/L   ALT 26 0 - 44 IU/L  Hepatitis C antibody  Result Value Ref Range   Hep C Virus Ab <0.1 0.0 - 0.9 s/co ratio  Lipid Panel w/o Chol/HDL Ratio  Result Value Ref Range   Cholesterol, Total 191 100 - 199 mg/dL   Triglycerides 136 0 - 149 mg/dL   HDL 62 >39 mg/dL   VLDL Cholesterol Cal 24 5 - 40 mg/dL   LDL Chol Calc (NIH) 105 (H) 0 - 99 mg/dL  TSH  Result Value Ref Range   TSH 1.170 0.450 - 4.500 uIU/mL  PSA  Result Value Ref Range   Prostate Specific Ag, Serum 0.3 0.0 - 4.0 ng/mL      Assessment & Plan:   Problem List Items Addressed This Visit       Cardiovascular and Mediastinum   Essential hypertension - Primary    Chronic, stable.  No  current medications.  Recommend he monitor BP at least a few mornings a week at home and document.  DASH diet at home.  Labs: CBC, CMP, TSH.  Return in 6 months.       Relevant Medications   atorvastatin (LIPITOR) 10 MG tablet   Other Relevant Orders   CBC with Differential/Platelet   Comprehensive metabolic panel   TSH     Respiratory   Allergic rhinitis    With sinus issues -- recommend start Claritin OTC 10 MG as needed + Flonase nasal spray.  If ongoing them return to office and will determine next steps.        Digestive   GERD without esophagitis    Chronic, ongoing.  Recommend heavy focus on diet changes, provided information on this, + cessation of smoking.  Will continue Protonix 20 MG daily as needed, reduce to discontinue in future.  Mag level today.        Relevant Medications   pantoprazole (PROTONIX) 20 MG tablet   Other Relevant Orders  Magnesium     Other   Hyperlipidemia    Chronic, ongoing.  Continue current medication regimen and adjust as needed.  Lipid panel and CMP today.  Refills sent.       Relevant Medications   atorvastatin (LIPITOR) 10 MG tablet   Other Relevant Orders   Comprehensive metabolic panel   Lipid Panel w/o Chol/HDL Ratio   Insomnia    Ongoing, mood affected more by poor sleep pattern.  Will trial Trazodone 25-50 MG at night as needed, which may offer benefit to mood and sleep pattern.  Wil notify provider if any issues with this.  Educated on medication and use.      Nicotine dependence, cigarettes, uncomplicated    I have recommended complete cessation of tobacco use. I have discussed various options available for assistance with tobacco cessation including over the counter methods (Nicotine gum, patch and lozenges). We also discussed prescription options (Chantix, Nicotine Inhaler / Nasal Spray). The patient is not interested in pursuing any prescription tobacco cessation options at this time.  Refuses lung cancer screening at this  time.       Situational anxiety    Ongoing.  Denies SI/HI.  Did not tolerate Celexa, nausea.  Issue is more sleep related.  Will trial Trazodone 25-50 MG at night as needed, which may offer benefit to mood and sleep pattern.  Wil notify provider if any issues with this.  Educated on medication and use.      Relevant Medications   traZODone (DESYREL) 50 MG tablet   Other Visit Diagnoses     Encounter for screening for HIV       HIV screen on labs today per guidelines for one time screening, discussed with patient.   Relevant Orders   HIV Antibody (routine testing w rflx)   Colon cancer screening       Cologuard ordered.   Relevant Orders   Cologuard   Encounter for annual physical exam       Annual physical today with labs and health maintenance reviewed, discussed with patient.        Discussed aspirin prophylaxis for myocardial infarction prevention and decision was it was not indicated  LABORATORY TESTING:  Health maintenance labs ordered today as discussed above.   The natural history of prostate cancer and ongoing controversy regarding screening and potential treatment outcomes of prostate cancer has been discussed with the patient. The meaning of a false positive PSA and a false negative PSA has been discussed. He indicates understanding of the limitations of this screening test and wishes  to proceed with screening PSA testing.   IMMUNIZATIONS:   - Tdap: Tetanus vaccination status reviewed: last tetanus booster within 10 years. - Influenza: Refused - Pneumovax: Not applicable - Prevnar: Not applicable - Zostavax vaccine: Refused  - Covid vaccine -- refused  SCREENING: - Colonoscopy: Cologuard ordered today and due Discussed with patient purpose of the colonoscopy is to detect colon cancer at curable precancerous or early stages   - AAA Screening: Not applicable  -Hearing Test: Not applicable  -Spirometry: Not applicable   PATIENT COUNSELING:    Sexuality:  Discussed sexually transmitted diseases, partner selection, use of condoms, avoidance of unintended pregnancy  and contraceptive alternatives.   Advised to avoid cigarette smoking.  I discussed with the patient that most people either abstain from alcohol or drink within safe limits (<=14/week and <=4 drinks/occasion for males, <=7/weeks and <= 3 drinks/occasion for females) and that the risk for alcohol disorders and  other health effects rises proportionally with the number of drinks per week and how often a drinker exceeds daily limits.  Discussed cessation/primary prevention of drug use and availability of treatment for abuse.   Diet: Encouraged to adjust caloric intake to maintain  or achieve ideal body weight, to reduce intake of dietary saturated fat and total fat, to limit sodium intake by avoiding high sodium foods and not adding table salt, and to maintain adequate dietary potassium and calcium preferably from fresh fruits, vegetables, and low-fat dairy products.    Stressed the importance of regular exercise  Injury prevention: Discussed safety belts, safety helmets, smoke detector, smoking near bedding or upholstery.   Dental health: Discussed importance of regular tooth brushing, flossing, and dental visits.   Follow up plan: NEXT PREVENTATIVE PHYSICAL DUE IN 1 YEAR. Return in about 1 year (around 02/21/2024) for Annual Physical.

## 2023-02-20 NOTE — Assessment & Plan Note (Signed)
Chronic, ongoing.  Recommend heavy focus on diet changes, provided information on this, + cessation of smoking.  Will continue Protonix 20 MG daily as needed, reduce to discontinue in future.  Mag level today.

## 2023-02-20 NOTE — Assessment & Plan Note (Signed)
I have recommended complete cessation of tobacco use. I have discussed various options available for assistance with tobacco cessation including over the counter methods (Nicotine gum, patch and lozenges). We also discussed prescription options (Chantix, Nicotine Inhaler / Nasal Spray). The patient is not interested in pursuing any prescription tobacco cessation options at this time.  Refuses lung cancer screening at this time.

## 2023-02-20 NOTE — Assessment & Plan Note (Signed)
Ongoing.  Denies SI/HI.  Did not tolerate Celexa, nausea.  Issue is more sleep related.  Will trial Trazodone 25-50 MG at night as needed, which may offer benefit to mood and sleep pattern.  Wil notify provider if any issues with this.  Educated on medication and use.

## 2023-02-20 NOTE — Assessment & Plan Note (Signed)
Chronic, stable.  No current medications.  Recommend he monitor BP at least a few mornings a week at home and document.  DASH diet at home.  Labs: CBC, CMP, TSH.  Return in 6 months.

## 2023-02-20 NOTE — Assessment & Plan Note (Signed)
Ongoing, mood affected more by poor sleep pattern.  Will trial Trazodone 25-50 MG at night as needed, which may offer benefit to mood and sleep pattern.  Wil notify provider if any issues with this.  Educated on medication and use.

## 2023-02-20 NOTE — Assessment & Plan Note (Signed)
Chronic, ongoing.  Continue current medication regimen and adjust as needed.  Lipid panel and CMP today.  Refills sent.

## 2023-02-21 LAB — CBC WITH DIFFERENTIAL/PLATELET
Basophils Absolute: 0.1 10*3/uL (ref 0.0–0.2)
Basos: 1 %
EOS (ABSOLUTE): 0.1 10*3/uL (ref 0.0–0.4)
Eos: 1 %
Hematocrit: 45.2 % (ref 37.5–51.0)
Hemoglobin: 15.3 g/dL (ref 13.0–17.7)
Immature Grans (Abs): 0 10*3/uL (ref 0.0–0.1)
Immature Granulocytes: 0 %
Lymphocytes Absolute: 2.2 10*3/uL (ref 0.7–3.1)
Lymphs: 29 %
MCH: 29.4 pg (ref 26.6–33.0)
MCHC: 33.8 g/dL (ref 31.5–35.7)
MCV: 87 fL (ref 79–97)
Monocytes Absolute: 0.6 10*3/uL (ref 0.1–0.9)
Monocytes: 8 %
Neutrophils Absolute: 4.6 10*3/uL (ref 1.4–7.0)
Neutrophils: 61 %
Platelets: 225 10*3/uL (ref 150–450)
RBC: 5.2 x10E6/uL (ref 4.14–5.80)
RDW: 13.1 % (ref 11.6–15.4)
WBC: 7.5 10*3/uL (ref 3.4–10.8)

## 2023-02-21 LAB — COMPREHENSIVE METABOLIC PANEL
ALT: 22 IU/L (ref 0–44)
AST: 24 IU/L (ref 0–40)
Albumin/Globulin Ratio: 2.3 — ABNORMAL HIGH (ref 1.2–2.2)
Albumin: 4.6 g/dL (ref 3.8–4.9)
Alkaline Phosphatase: 84 IU/L (ref 44–121)
BUN/Creatinine Ratio: 11 (ref 9–20)
BUN: 9 mg/dL (ref 6–24)
Bilirubin Total: 0.5 mg/dL (ref 0.0–1.2)
CO2: 23 mmol/L (ref 20–29)
Calcium: 9.3 mg/dL (ref 8.7–10.2)
Chloride: 105 mmol/L (ref 96–106)
Creatinine, Ser: 0.85 mg/dL (ref 0.76–1.27)
Globulin, Total: 2 g/dL (ref 1.5–4.5)
Glucose: 87 mg/dL (ref 70–99)
Potassium: 4.2 mmol/L (ref 3.5–5.2)
Sodium: 141 mmol/L (ref 134–144)
Total Protein: 6.6 g/dL (ref 6.0–8.5)
eGFR: 102 mL/min/{1.73_m2} (ref >59)

## 2023-02-21 LAB — LIPID PANEL W/O CHOL/HDL RATIO
Cholesterol, Total: 166 mg/dL (ref 100–199)
HDL: 61 mg/dL (ref >39)
LDL Chol Calc (NIH): 87 mg/dL (ref 0–99)
Triglycerides: 102 mg/dL (ref 0–149)
VLDL Cholesterol Cal: 18 mg/dL (ref 5–40)

## 2023-02-21 LAB — TSH: TSH: 2.54 u[IU]/mL (ref 0.450–4.500)

## 2023-02-21 LAB — MAGNESIUM: Magnesium: 2.2 mg/dL (ref 1.6–2.3)

## 2023-02-21 LAB — HIV ANTIBODY (ROUTINE TESTING W REFLEX): HIV Screen 4th Generation wRfx: NONREACTIVE

## 2023-02-21 NOTE — Progress Notes (Signed)
Good afternoon, please let Tymere know that overall labs remain stable and no medication changes needed at this time.  Continue current medication regimen.  Any questions? Keep being awesome!!  Thank you for allowing me to participate in your care.  I appreciate you. Kindest regards, Jossette Zirbel

## 2023-02-22 ENCOUNTER — Telehealth: Payer: Self-pay | Admitting: Nurse Practitioner

## 2023-02-22 NOTE — Telephone Encounter (Signed)
Patient called for lab results. Results given and patient verbal understanding.

## 2023-03-08 ENCOUNTER — Encounter: Payer: 59 | Admitting: Nurse Practitioner

## 2023-03-08 ENCOUNTER — Telehealth: Payer: Self-pay | Admitting: Nurse Practitioner

## 2023-03-08 LAB — COLOGUARD: COLOGUARD: NEGATIVE

## 2023-03-08 NOTE — Telephone Encounter (Signed)
Pt. Given Colo guard results, verbalizes understanding.

## 2023-03-08 NOTE — Progress Notes (Signed)
Please let Gavin Chaney know cologuard returned negative.  Great news!!  Repeat in 3 years.

## 2023-03-14 ENCOUNTER — Other Ambulatory Visit: Payer: Self-pay | Admitting: Nurse Practitioner

## 2023-03-15 NOTE — Telephone Encounter (Signed)
Requested medication (s) are due for refill today: No  Requested medication (s) are on the active medication list: Yes  Last refill:  02/20/23  Future visit scheduled: Yes  Notes to clinic:  Pharmacy requests 90 day supply.    Requested Prescriptions  Pending Prescriptions Disp Refills   traZODone (DESYREL) 50 MG tablet [Pharmacy Med Name: TRAZODONE 50 MG TABLET] 90 tablet 2    Sig: TAKE 0.5-1 TABLETS BY MOUTH AT BEDTIME AS NEEDED FOR SLEEP.     Psychiatry: Antidepressants - Serotonin Modulator Passed - 03/14/2023  1:30 PM      Passed - Valid encounter within last 6 months    Recent Outpatient Visits           3 weeks ago Essential hypertension   [place], Henrine Screws T, NP   1 year ago Essential hypertension   [place], Henrine Screws T, NP   2 years ago Mixed hyperlipidemia   [place], Henrine Screws T, NP   3 years ago Annual physical exam   [place], Henrine Screws T, NP   4 years ago Essential hypertension   [place], Barbaraann Faster, NP       Future Appointments             In 11 months [place], Barbaraann Faster, NP [place], [facility]

## 2023-04-05 ENCOUNTER — Ambulatory Visit (INDEPENDENT_AMBULATORY_CARE_PROVIDER_SITE_OTHER): Payer: 59 | Admitting: Podiatry

## 2023-04-05 DIAGNOSIS — M722 Plantar fascial fibromatosis: Secondary | ICD-10-CM

## 2023-04-05 NOTE — Progress Notes (Signed)
Patient came to the office and picked up a pair of powersteps, mens size 12.   Insoles dispensed. Patient wanted billed to insurance, but was told might not be covered.

## 2023-12-07 ENCOUNTER — Encounter: Payer: Self-pay | Admitting: Nurse Practitioner

## 2023-12-07 ENCOUNTER — Ambulatory Visit (INDEPENDENT_AMBULATORY_CARE_PROVIDER_SITE_OTHER): Payer: 59 | Admitting: Nurse Practitioner

## 2023-12-07 VITALS — BP 123/77 | HR 67 | Temp 97.5°F | Ht 68.0 in | Wt 182.7 lb

## 2023-12-07 DIAGNOSIS — F419 Anxiety disorder, unspecified: Secondary | ICD-10-CM

## 2023-12-07 DIAGNOSIS — F320 Major depressive disorder, single episode, mild: Secondary | ICD-10-CM

## 2023-12-07 DIAGNOSIS — R200 Anesthesia of skin: Secondary | ICD-10-CM | POA: Diagnosis not present

## 2023-12-07 DIAGNOSIS — F5101 Primary insomnia: Secondary | ICD-10-CM | POA: Diagnosis not present

## 2023-12-07 DIAGNOSIS — F17210 Nicotine dependence, cigarettes, uncomplicated: Secondary | ICD-10-CM

## 2023-12-07 MED ORDER — TRAZODONE HCL 50 MG PO TABS: 25.0000 mg | ORAL_TABLET | Freq: Every evening | ORAL | 5 refills | Status: DC | PRN

## 2023-12-07 MED ORDER — ESCITALOPRAM OXALATE 5 MG PO TABS: 5.0000 mg | ORAL_TABLET | Freq: Every day | ORAL | 4 refills | Status: DC

## 2023-12-07 NOTE — Assessment & Plan Note (Signed)
I have recommended complete cessation of tobacco use. I have discussed various options available for assistance with tobacco cessation including over the counter methods (Nicotine gum, patch and lozenges). We also discussed prescription options (Chantix, Nicotine Inhaler / Nasal Spray). The patient is not interested in pursuing any prescription tobacco cessation options at this time.  Refuses lung cancer screening at this time.

## 2023-12-07 NOTE — Progress Notes (Signed)
BP 123/77   Pulse 67   Temp (!) 97.5 F (36.4 C) (Oral)   Ht 5\' 8"  (1.727 m)   Wt 182 lb 11.2 oz (82.9 kg)   SpO2 99%   BMI 27.78 kg/m    Subjective:    Patient ID: Gavin Chaney, male    DOB: 03/28/1966, 57 y.o.   MRN: [id number]  HPI: Gavin Chaney is a 57 y.o. male  Chief Complaint  Patient presents with   Anxiety   Depression   Numbness    Patient states he has been feeling numbness, tingling and cold sensations in his legs, Chaney, and feet    ANXIETY/STRESS Presents today for increased anxiety, feels numb all over with this. Taking over the counter supplements.  Sometimes feels really down. Currently has no stressors specifically, but is stressed he reports.  No family history of anxiety or depression.  Lives by self and reports if he goes home will just sleep.  Thinks about things he did in past and what he should have done better.   Has taken Celexa (made him feel bad) and Trazodone.  He stopped taking Trazodone and does not recall if it helped. Duration:uncontrolled Anxious mood: yes  Excessive worrying: yes Irritability: yes  Sweating: no Nausea: no Palpitations:no Hyperventilation: no Panic attacks: no Agoraphobia: no  Obscessions/compulsions: no Depressed mood: yes    2023-12-15   11:09 AM 02/20/2023    9:45 AM 12/14/21   11:40 AM 02/17/2021   11:07 AM 02/11/2020   10:36 AM  Depression screen PHQ 2/9  Decreased Interest 2 2 2 2 1   Down, Depressed, Hopeless 2 2 2 3 1   PHQ - 2 Score 4 4 4 5 2   Altered sleeping 3 2 3 3 1   Tired, decreased energy 3 2 3 3 1   Change in appetite 1 0 0 3 0  Feeling bad or failure about yourself  2 0 0 2 0  Trouble concentrating 2 0 1 2 0  Moving slowly or fidgety/restless 0 0 0 1 0  Suicidal thoughts 0  0 0 0  PHQ-9 Score 15 8 11 19 4   Difficult doing work/chores Somewhat difficult  Somewhat difficult Very difficult Not difficult at all  Anhedonia: yes Weight changes: no Insomnia: yes hard to fall asleep   Hypersomnia: no Fatigue/loss of energy: yes Feelings of worthlessness: yes Feelings of guilt: no Impaired concentration/indecisiveness: no Suicidal ideations: no  Crying spells: no Recent Stressors/Life Changes: no   Relationship problems: no   Family stress: no     Financial stress: no    Job stress: no    Recent death/loss: no     12/15/23   11:09 AM 02/20/2023    9:46 AM 12/14/2021   11:40 AM 02/11/2020   10:37 AM  GAD 7 : Generalized Anxiety Score  Nervous, Anxious, on Edge 3 3 2 2   Control/stop worrying 2 2 2 2   Worry too much - different things 2 2 2 1   Trouble relaxing 2 2 2 2   Restless 2  2 0  Easily annoyed or irritable 1  2 2   Afraid - awful might happen 1 2 0 0  Total GAD 7 Score 13  12 9   Anxiety Difficulty Somewhat difficult Somewhat difficult Somewhat difficult Not difficult at all   NUMBNESS Started 2 months ago to Chaney, legs, and all over face.  Notices this almost every day and all day.  At times feels prickle type sensation.  No alcohol  or drug use.  Does smoke <1 PPD. Duration: months Onset: gradual Location: as above Bilateral: yes Symmetric: yes Decreased sensation: no  Weakness: no Pain:  irritating no pain Quality:  ill-defined and prickly Severity: mild  Frequency: constant Trauma: no Recent illness: no Diabetes: no Thyroid disease: no  HIV: no  Alcoholism: no  Spinal cord injury: no Alleviating factors: unknown Aggravating factors: unknown Status: uncontrolled Treatments attempted: none  Relevant past medical, surgical, family and social history reviewed and updated as indicated. Interim medical history since our last visit reviewed. Allergies and medications reviewed and updated.  Review of Systems  Constitutional:  Negative for activity change, diaphoresis, fatigue and fever.  Respiratory:  Negative for cough, chest tightness, shortness of breath and wheezing.   Cardiovascular:  Negative for chest pain, palpitations and leg  swelling.  Gastrointestinal: Negative.   Neurological:  Positive for numbness. Negative for dizziness, syncope, weakness, light-headedness and headaches.  Psychiatric/Behavioral:  Positive for sleep disturbance. Negative for decreased concentration, self-injury and suicidal ideas. The patient is nervous/anxious.    Per HPI unless specifically indicated above     Objective:    BP 123/77   Pulse 67   Temp (!) 97.5 F (36.4 C) (Oral)   Ht 5\' 8"  (1.727 m)   Wt 182 lb 11.2 oz (82.9 kg)   SpO2 99%   BMI 27.78 kg/m   Wt Readings from Last 3 Encounters:  12/07/23 182 lb 11.2 oz (82.9 kg)  02/20/23 180 lb (81.6 kg)  12/06/21 183 lb 6.4 oz (83.2 kg)    Physical Exam Vitals and nursing note reviewed.  Constitutional:      General: He is awake. He is not in acute distress.    Appearance: He is well-developed and well-groomed. He is not ill-appearing or toxic-appearing.  HENT:     Head: Normocephalic.     Right Ear: Hearing and external ear normal.     Left Ear: Hearing and external ear normal.  Eyes:     General: Lids are normal.     Extraocular Movements: Extraocular movements intact.     Conjunctiva/sclera: Conjunctivae normal.  Neck:     Thyroid: No thyromegaly.     Vascular: No carotid bruit.  Cardiovascular:     Rate and Rhythm: Normal rate and regular rhythm.     Heart sounds: Normal heart sounds. No murmur heard.    No gallop.  Pulmonary:     Effort: No accessory muscle usage or respiratory distress.     Breath sounds: Normal breath sounds.  Abdominal:     General: Bowel sounds are normal. There is no distension.     Palpations: Abdomen is soft.     Tenderness: There is no abdominal tenderness.  Musculoskeletal:     Cervical back: Full passive range of motion without pain.     Right lower leg: No edema.     Left lower leg: No edema.  Lymphadenopathy:     Cervical: No cervical adenopathy.  Skin:    General: Skin is warm.     Capillary Refill: Capillary refill  takes less than 2 seconds.  Neurological:     Mental Status: He is alert and oriented to person, place, and time.     Cranial Nerves: Cranial nerves 2-12 are intact.     Sensory: Sensation is intact.     Motor: Motor function is intact.     Coordination: Coordination is intact.     Gait: Gait is intact.     Deep  Tendon Reflexes: Reflexes are normal and symmetric.     Reflex Scores:      Brachioradialis reflexes are 2+ on the right side and 2+ on the left side.      Patellar reflexes are 2+ on the right side and 2+ on the left side.    Comments: Sensation present to BLE and BUE + face.  Psychiatric:        Attention and Perception: Attention normal.        Mood and Affect: Mood normal.        Speech: Speech normal.        Behavior: Behavior normal. Behavior is cooperative.        Thought Content: Thought content normal.    Results for orders placed or performed in visit on 02/20/23  CBC with Differential/Platelet   Collection Time: 02/20/23  9:47 AM  Result Value Ref Range   WBC 7.5 3.4 - 10.8 x10E3/uL   RBC 5.20 4.14 - 5.80 x10E6/uL   Hemoglobin 15.3 13.0 - 17.7 g/dL   Hematocrit 08.6 57.8 - 51.0 %   MCV 87 79 - 97 fL   MCH 29.4 26.6 - 33.0 pg   MCHC 33.8 31.5 - 35.7 g/dL   RDW 46.9 62.9 - 52.8 %   Platelets 225 150 - 450 x10E3/uL   Neutrophils 61 Not Estab. %   Lymphs 29 Not Estab. %   Monocytes 8 Not Estab. %   Eos 1 Not Estab. %   Basos 1 Not Estab. %   Neutrophils Absolute 4.6 1.4 - 7.0 x10E3/uL   Lymphocytes Absolute 2.2 0.7 - 3.1 x10E3/uL   Monocytes Absolute 0.6 0.1 - 0.9 x10E3/uL   EOS (ABSOLUTE) 0.1 0.0 - 0.4 x10E3/uL   Basophils Absolute 0.1 0.0 - 0.2 x10E3/uL   Immature Granulocytes 0 Not Estab. %   Immature Grans (Abs) 0.0 0.0 - 0.1 x10E3/uL  Comprehensive metabolic panel   Collection Time: 02/20/23  9:47 AM  Result Value Ref Range   Glucose 87 70 - 99 mg/dL   BUN 9 6 - 24 mg/dL   Creatinine, Ser 4.13 0.76 - 1.27 mg/dL   eGFR 244 >01 UU/VOZ/3.66    BUN/Creatinine Ratio 11 9 - 20   Sodium 141 134 - 144 mmol/L   Potassium 4.2 3.5 - 5.2 mmol/L   Chloride 105 96 - 106 mmol/L   CO2 23 20 - 29 mmol/L   Calcium 9.3 8.7 - 10.2 mg/dL   Total Protein 6.6 6.0 - 8.5 g/dL   Albumin 4.6 3.8 - 4.9 g/dL   Globulin, Total 2.0 1.5 - 4.5 g/dL   Albumin/Globulin Ratio 2.3 (H) 1.2 - 2.2   Bilirubin Total 0.5 0.0 - 1.2 mg/dL   Alkaline Phosphatase 84 44 - 121 IU/L   AST 24 0 - 40 IU/L   ALT 22 0 - 44 IU/L  Lipid Panel w/o Chol/HDL Ratio   Collection Time: 02/20/23  9:47 AM  Result Value Ref Range   Cholesterol, Total 166 100 - 199 mg/dL   Triglycerides 440 0 - 149 mg/dL   HDL 61 >34 mg/dL   VLDL Cholesterol Cal 18 5 - 40 mg/dL   LDL Chol Calc (NIH) 87 0 - 99 mg/dL  TSH   Collection Time: 02/20/23  9:47 AM  Result Value Ref Range   TSH 2.540 0.450 - 4.500 uIU/mL  Magnesium   Collection Time: 02/20/23  9:47 AM  Result Value Ref Range   Magnesium 2.2 1.6 - 2.3 mg/dL  HIV Antibody (routine testing w rflx)   Collection Time: 02/20/23  9:47 AM  Result Value Ref Range   HIV Screen 4th Generation wRfx Non Reactive Non Reactive  Cologuard   Collection Time: 02/27/23  8:20 AM  Result Value Ref Range   COLOGUARD Negative Negative      Assessment & Plan:   Problem List Items Addressed This Visit       Other   Anxiety   Refer to depression plan of care.      Relevant Medications   escitalopram (LEXAPRO) 5 MG tablet   traZODone (DESYREL) 50 MG tablet   Depression, major, single episode, mild (HCC) - Primary   Ongoing with anxiety.  Denies SI/HI.  No guns in home.  He is aware if SI were to present to immediately go to ER or reach out to provider.  At this time will try Lexapro 5 MG daily, educated him on this and side effects + BLACK BOX warning.  Explained to them that drugs of the SSRI class can have side effects such as weight gain, sexual dysfunction, insomnia, headache, nausea + can take 6 weeks to get to full potential of benefit. To  return in 6 weeks.      Relevant Medications   escitalopram (LEXAPRO) 5 MG tablet   traZODone (DESYREL) 50 MG tablet   Insomnia   Ongoing, mood affected more by poor sleep pattern.  Will send in Trazodone 25-50 MG at night as needed, which may offer benefit to mood and sleep pattern.  He did not try this last time sent.  Will notify provider if any issues with this.  Educated on medication and use.      Nicotine dependence, cigarettes, uncomplicated   I have recommended complete cessation of tobacco use. I have discussed various options available for assistance with tobacco cessation including over the counter methods (Nicotine gum, patch and lozenges). We also discussed prescription options (Chantix, Nicotine Inhaler / Nasal Spray). The patient is not interested in pursuing any prescription tobacco cessation options at this time.  Refuses lung cancer screening at this time.       Numbness   For a couple months with increased anxiety and stress, ?if related to this.  Overall reassuring neuro exam with no red flags.  Check labs today to include CMP. B12, TSH, ESR, and CRP.  Will treat anxiety and see if improves numbness.      Relevant Orders   Comprehensive metabolic panel   TSH   Vitamin B12   C-reactive protein   Sed Rate (ESR)     Follow up plan: Return in about 6 weeks (around 01/18/2024) for MOOD AND NUMBNESS.

## 2023-12-07 NOTE — Assessment & Plan Note (Signed)
Ongoing, mood affected more by poor sleep pattern.  Will send in Trazodone 25-50 MG at night as needed, which may offer benefit to mood and sleep pattern.  He did not try this last time sent.  Will notify provider if any issues with this.  Educated on medication and use.

## 2023-12-07 NOTE — Patient Instructions (Signed)
 Managing Anxiety, Adult  After being diagnosed with anxiety, you may be relieved to know why you have felt or behaved a certain way. You may also feel overwhelmed about the treatment ahead and what it will mean for your life. With care and support, you can manage your anxiety.  How to manage lifestyle changes  Understanding the difference between stress and anxiety  Although stress can play a role in anxiety, it is not the same as anxiety. Stress is your body's reaction to life changes and events, both good and bad. Stress is often caused by something external, such as a deadline, test, or competition. It normally goes away after the event has ended and will last just a few hours. But, stress can be ongoing and can lead to more than just stress.  Anxiety is caused by something internal, such as imagining a terrible outcome or worrying that something will go wrong that will greatly upset you. Anxiety often does not go away even after the event is over, and it can become a long-term (chronic) worry.  Lowering stress and anxiety    Talk with your health care provider or a counselor to learn more about lowering anxiety and stress. They may suggest tension-reduction techniques, such as:  Music. Spend time creating or listening to music that you enjoy and that inspires you.  Mindfulness-based meditation. Practice being aware of your normal breaths while not trying to control your breathing. It can be done while sitting or walking.  Centering prayer. Focus on a word, phrase, or sacred image that means something to you and brings you peace.  Deep breathing. Expand your stomach and inhale slowly through your nose. Hold your breath for 3-5 seconds. Then breathe out slowly, letting your stomach muscles relax.  Self-talk. Learn to notice and spot thought patterns that lead to anxiety reactions. Change those patterns to thoughts that feel peaceful.  Muscle relaxation. Take time to tense muscles and then relax them.  Choose a  tension-reduction technique that fits your lifestyle and personality. These techniques take time and practice. Set aside 5-15 minutes a day to do them. Specialized therapists can offer counseling and training in these techniques. The training to help with anxiety may be covered by some insurance plans.  Other things you can do to manage stress and anxiety include:  Keeping a stress diary. This can help you learn what triggers your reaction and then learn ways to manage your response.  Thinking about how you react to certain situations. You may not be able to control everything, but you can control your response.  Making time for activities that help you relax and not feeling guilty about spending your time in this way.  Doing visual imagery. This involves imagining or creating mental pictures to help you relax.  Practicing yoga. Through yoga poses, you can lower tension and relax.     Medicines  Medicines for anxiety include:  Antidepressant medicines. These are usually prescribed for long-term daily control.  Anti-anxiety medicines. These may be added in severe cases, especially when panic attacks occur.  When used together, medicines, psychotherapy, and tension-reduction techniques may be the most effective treatment.  Relationships  Relationships can play a big part in helping you recover. Spend more time connecting with trusted friends and family members. Think about going to couples counseling if you have a partner, taking family education classes, or going to family therapy. Therapy can help you and others better understand your anxiety.  How to recognize changes in  your anxiety  Everyone responds differently to treatment for anxiety. Recovery from anxiety happens when symptoms lessen and stop interfering with your daily life at home or work. This may mean that you will start to:  Have better concentration and focus. Worry will interfere less in your daily thinking.  Sleep better.  Be less irritable.  Have  more energy.  Have improved memory.  Try to recognize when your condition is getting worse. Contact your provider if your symptoms interfere with home or work and you feel like your condition is not improving.  Follow these instructions at home:  Activity  Exercise. Adults should:  Exercise for at least 150 minutes each week. The exercise should increase your heart rate and make you sweat (moderate-intensity exercise).  Do strengthening exercises at least twice a week.  Get the right amount and quality of sleep. Most adults need 7-9 hours of sleep each night.  Lifestyle    Eat a healthy diet that includes plenty of vegetables, fruits, whole grains, low-fat dairy products, and lean protein.  Do not eat a lot of foods that are high in fats, added sugars, or salt (sodium).  Make choices that simplify your life.  Do not use any products that contain nicotine or tobacco. These products include cigarettes, chewing tobacco, and vaping devices, such as e-cigarettes. If you need help quitting, ask your provider.  Avoid caffeine, alcohol, and certain over-the-counter cold medicines. These may make you feel worse. Ask your pharmacist which medicines to avoid.  General instructions  Take over-the-counter and prescription medicines only as told by your provider.  Keep all follow-up visits. This is to make sure you are managing your anxiety well or if you need more support.  Where to find support  You can get help and support from:  Self-help groups.  Online and [employer].  A trusted spiritual leader.  Couples counseling.  Family education classes.  Family therapy.  Where to find more information  You may find that joining a support group helps you deal with your anxiety. The following sources can help you find counselors or support groups near you:  Mental Health America: mentalhealthamerica.net  Anxiety and Depression Association of [place] (ADAA): adaa.org  [employer] on Mental Illness (NAMI):  nami.org  Contact a health care provider if:  You have a hard time staying focused or finishing tasks.  You spend many hours a day feeling worried about everyday life.  You are very tired because you cannot stop worrying.  You start to have headaches or often feel tense.  You have chronic nausea or diarrhea.  Get help right away if:  Your heart feels like it is racing.  You have shortness of breath.  You have thoughts of hurting yourself or others.  Get help right away if you feel like you may hurt yourself or others, or have thoughts about taking your own life. Go to your nearest emergency room or:  Call 911.  Call the National Suicide Prevention Lifeline at 417-380-0019 or 988. This is open 24 hours a day.  Text the Crisis Text Line at 4151481703.  This information is not intended to replace advice given to you by your health care provider. Make sure you discuss any questions you have with your health care provider.  Document Revised: 09/20/2022 Document Reviewed: 04/04/2021  Elsevier Patient Education  2024 [employer].

## 2023-12-07 NOTE — Assessment & Plan Note (Signed)
Ongoing with anxiety.  Denies SI/HI.  No guns in home.  He is aware if SI were to present to immediately go to ER or reach out to provider.  At this time will try Lexapro 5 MG daily, educated him on this and side effects + BLACK BOX warning.  Explained to them that drugs of the SSRI class can have side effects such as weight gain, sexual dysfunction, insomnia, headache, nausea + can take 6 weeks to get to full potential of benefit. To return in 6 weeks.

## 2023-12-07 NOTE — Assessment & Plan Note (Signed)
Refer to depression plan of care. 

## 2023-12-07 NOTE — Assessment & Plan Note (Signed)
For a couple months with increased anxiety and stress, ?if related to this.  Overall reassuring neuro exam with no red flags.  Check labs today to include CMP. B12, TSH, ESR, and CRP.  Will treat anxiety and see if improves numbness.

## 2023-12-08 LAB — COMPREHENSIVE METABOLIC PANEL
ALT: 26 [IU]/L (ref 0–44)
AST: 20 [IU]/L (ref 0–40)
Albumin: 4.6 g/dL (ref 3.8–4.9)
Alkaline Phosphatase: 85 [IU]/L (ref 44–121)
BUN/Creatinine Ratio: 11 (ref 9–20)
BUN: 9 mg/dL (ref 6–24)
Bilirubin Total: 0.4 mg/dL (ref 0.0–1.2)
CO2: 23 mmol/L (ref 20–29)
Calcium: 9.7 mg/dL (ref 8.7–10.2)
Chloride: 105 mmol/L (ref 96–106)
Creatinine, Ser: 0.83 mg/dL (ref 0.76–1.27)
Globulin, Total: 2.5 g/dL (ref 1.5–4.5)
Glucose: 79 mg/dL (ref 70–99)
Potassium: 4.1 mmol/L (ref 3.5–5.2)
Sodium: 142 mmol/L (ref 134–144)
Total Protein: 7.1 g/dL (ref 6.0–8.5)
eGFR: 102 mL/min/{1.73_m2} (ref >59)

## 2023-12-08 LAB — TSH: TSH: 1.9 u[IU]/mL (ref 0.450–4.500)

## 2023-12-08 LAB — SEDIMENTATION RATE: Sed Rate: 12 mm/h (ref 0–30)

## 2023-12-08 LAB — VITAMIN B12: Vitamin B-12: 459 pg/mL (ref 232–1245)

## 2023-12-08 LAB — C-REACTIVE PROTEIN: CRP: <1 mg/L (ref 0–10)

## 2023-12-08 NOTE — Progress Notes (Signed)
Good afternoon, please let Gavin Chaney know his labs have returned and overall are stable.  No acute findings to explain recent symptoms.  Try the medication for anxiety and we will see how he feels at next visit.  Any questions? Keep being stellar!!  Thank you for allowing me to participate in your care.  I appreciate you. Kindest regards, Noa Constante

## 2023-12-29 ENCOUNTER — Other Ambulatory Visit: Payer: Self-pay | Admitting: Nurse Practitioner

## 2024-01-02 NOTE — Telephone Encounter (Signed)
 Requested medication (s) are due for refill today: no  Requested medication (s) are on the active medication list: yes  Last refill:  both refilled 12/07/23 Trazodone : 45 5 RF   and escitalopram : 11/27/23 #45 4 RF  Future visit scheduled: yes  Notes to clinic:  pharmacy requesting 90 day fill    Requested Prescriptions  Pending Prescriptions Disp Refills   traZODone  (DESYREL ) 50 MG tablet [Pharmacy Med Name: TRAZODONE  50 MG TABLET] 90 tablet 3    Sig: TAKE 0.5-1 TABLETS BY MOUTH AT BEDTIME AS NEEDED FOR SLEEP.     Psychiatry: Antidepressants - Serotonin Modulator Passed - 01/02/2024 12:17 PM      Passed - Completed PHQ-2 or PHQ-9 in the last 360 days      Passed - Valid encounter within last 6 months    Recent Outpatient Visits           3 weeks ago Depression, major, single episode, mild (HCC)   [place] [facility] [place], Melanie DASEN, NP   10 months ago Essential hypertension   [place] [facility] [place], Melanie T, NP   2 years ago Essential hypertension   [place] [facility] [place], [place] T, NP   2 years ago Mixed hyperlipidemia   [place] [facility] [place], Melanie T, NP   3 years ago Annual physical exam   [place] [facility] [place], Melanie DASEN, NP       Future Appointments             In 2 weeks [place], Jolene T, NP [place] [facility]   In 1 month [place], [place] T, NP [place] [facility]             escitalopram  (LEXAPRO ) 5 MG tablet [Pharmacy Med Name: ESCITALOPRAM  5 MG TABLET] 90 tablet 3    Sig: Take 1 tablet (5 mg total) by mouth daily.     Psychiatry:  Antidepressants - SSRI Passed - 01/02/2024 12:17 PM      Passed - Completed PHQ-2 or PHQ-9 in the last 360 days      Passed - Valid encounter within last 6 months    Recent Outpatient Visits           3 weeks ago Depression, major, single episode, mild (HCC)   [place]  [facility] [place], Melanie DASEN, NP   10 months ago Essential hypertension   [place] [facility] [place], Melanie T, NP   2 years ago Essential hypertension   [place] [facility] [place], Melanie T, NP   2 years ago Mixed hyperlipidemia   [place] [facility] [place], Melanie T, NP   3 years ago Annual physical exam   [place] [facility] [place], Melanie DASEN, NP       Future Appointments             In 2 weeks Cannady, Jolene T, NP [place] [facility]   In 1 month [place], Melanie DASEN, NP [place] [facility], [facility]

## 2024-01-14 NOTE — Patient Instructions (Incomplete)
 Managing Anxiety, Adult  After being diagnosed with anxiety, you may be relieved to know why you have felt or behaved a certain way. You may also feel overwhelmed about the treatment ahead and what it will mean for your life. With care and support, you can manage your anxiety.  How to manage lifestyle changes  Understanding the difference between stress and anxiety  Although stress can play a role in anxiety, it is not the same as anxiety. Stress is your body's reaction to life changes and events, both good and bad. Stress is often caused by something external, such as a deadline, test, or competition. It normally goes away after the event has ended and will last just a few hours. But, stress can be ongoing and can lead to more than just stress.  Anxiety is caused by something internal, such as imagining a terrible outcome or worrying that something will go wrong that will greatly upset you. Anxiety often does not go away even after the event is over, and it can become a long-term (chronic) worry.  Lowering stress and anxiety    Talk with your health care provider or a counselor to learn more about lowering anxiety and stress. They may suggest tension-reduction techniques, such as:  Music. Spend time creating or listening to music that you enjoy and that inspires you.  Mindfulness-based meditation. Practice being aware of your normal breaths while not trying to control your breathing. It can be done while sitting or walking.  Centering prayer. Focus on a word, phrase, or sacred image that means something to you and brings you peace.  Deep breathing. Expand your stomach and inhale slowly through your nose. Hold your breath for 3-5 seconds. Then breathe out slowly, letting your stomach muscles relax.  Self-talk. Learn to notice and spot thought patterns that lead to anxiety reactions. Change those patterns to thoughts that feel peaceful.  Muscle relaxation. Take time to tense muscles and then relax them.  Choose a  tension-reduction technique that fits your lifestyle and personality. These techniques take time and practice. Set aside 5-15 minutes a day to do them. Specialized therapists can offer counseling and training in these techniques. The training to help with anxiety may be covered by some insurance plans.  Other things you can do to manage stress and anxiety include:  Keeping a stress diary. This can help you learn what triggers your reaction and then learn ways to manage your response.  Thinking about how you react to certain situations. You may not be able to control everything, but you can control your response.  Making time for activities that help you relax and not feeling guilty about spending your time in this way.  Doing visual imagery. This involves imagining or creating mental pictures to help you relax.  Practicing yoga. Through yoga poses, you can lower tension and relax.     Medicines  Medicines for anxiety include:  Antidepressant medicines. These are usually prescribed for long-term daily control.  Anti-anxiety medicines. These may be added in severe cases, especially when panic attacks occur.  When used together, medicines, psychotherapy, and tension-reduction techniques may be the most effective treatment.  Relationships  Relationships can play a big part in helping you recover. Spend more time connecting with trusted friends and family members. Think about going to couples counseling if you have a partner, taking family education classes, or going to family therapy. Therapy can help you and others better understand your anxiety.  How to recognize changes in  your anxiety  Everyone responds differently to treatment for anxiety. Recovery from anxiety happens when symptoms lessen and stop interfering with your daily life at home or work. This may mean that you will start to:  Have better concentration and focus. Worry will interfere less in your daily thinking.  Sleep better.  Be less irritable.  Have  more energy.  Have improved memory.  Try to recognize when your condition is getting worse. Contact your provider if your symptoms interfere with home or work and you feel like your condition is not improving.  Follow these instructions at home:  Activity  Exercise. Adults should:  Exercise for at least 150 minutes each week. The exercise should increase your heart rate and make you sweat (moderate-intensity exercise).  Do strengthening exercises at least twice a week.  Get the right amount and quality of sleep. Most adults need 7-9 hours of sleep each night.  Lifestyle    Eat a healthy diet that includes plenty of vegetables, fruits, whole grains, low-fat dairy products, and lean protein.  Do not eat a lot of foods that are high in fats, added sugars, or salt (sodium).  Make choices that simplify your life.  Do not use any products that contain nicotine or tobacco. These products include cigarettes, chewing tobacco, and vaping devices, such as e-cigarettes. If you need help quitting, ask your provider.  Avoid caffeine, alcohol, and certain over-the-counter cold medicines. These may make you feel worse. Ask your pharmacist which medicines to avoid.  General instructions  Take over-the-counter and prescription medicines only as told by your provider.  Keep all follow-up visits. This is to make sure you are managing your anxiety well or if you need more support.  Where to find support  You can get help and support from:  Self-help groups.  Online and [employer].  A trusted spiritual leader.  Couples counseling.  Family education classes.  Family therapy.  Where to find more information  You may find that joining a support group helps you deal with your anxiety. The following sources can help you find counselors or support groups near you:  Mental Health [place]: mentalhealthamerica.net  Anxiety and [employer] Association of [place] (ADAA): adaa.org  [employer] Mental [employer]ness (NAMI):  nami.org  Contact a health care provider if:  You have a hard time staying focused or finishing tasks.  You spend many hours a day feeling worried about everyday life.  You are very tired because you cannot stop worrying.  You start to have headaches or often feel tense.  You have chronic nausea or diarrhea.  Get help right away if:  Your heart feels like it is racing.  You have shortness of breath.  You have thoughts of hurting yourself or others.  Get help right away if you feel like you may hurt yourself or others, or have thoughts about taking your own life. Go to your nearest emergency room or:  Call 911.  Call the National Suicide Prevention Lifeline at 417-380-0019 or 988. This is open 24 hours a day.  Text the Crisis Text Line at 4151481703.  This information is not intended to replace advice given to you by your health care provider. Make sure you discuss any questions you have with your health care provider.  Document Revised: 09/20/2022 Document Reviewed: 04/04/2021  Elsevier Patient Education  2024 [employer].

## 2024-01-18 ENCOUNTER — Ambulatory Visit: Payer: 59 | Admitting: Nurse Practitioner

## 2024-02-18 NOTE — Patient Instructions (Signed)
 Managing Anxiety, Adult  After being diagnosed with anxiety, you may be relieved to know why you have felt or behaved a certain way. You may also feel overwhelmed about the treatment ahead and what it will mean for your life. With care and support, you can manage your anxiety.  How to manage lifestyle changes  Understanding the difference between stress and anxiety  Although stress can play a role in anxiety, it is not the same as anxiety. Stress is your body's reaction to life changes and events, both good and bad. Stress is often caused by something external, such as a deadline, test, or competition. It normally goes away after the event has ended and will last just a few hours. But, stress can be ongoing and can lead to more than just stress.  Anxiety is caused by something internal, such as imagining a terrible outcome or worrying that something will go wrong that will greatly upset you. Anxiety often does not go away even after the event is over, and it can become a long-term (chronic) worry.  Lowering stress and anxiety    Talk with your health care provider or a counselor to learn more about lowering anxiety and stress. They may suggest tension-reduction techniques, such as:  Music. Spend time creating or listening to music that you enjoy and that inspires you.  Mindfulness-based meditation. Practice being aware of your normal breaths while not trying to control your breathing. It can be done while sitting or walking.  Centering prayer. Focus on a word, phrase, or sacred image that means something to you and brings you peace.  Deep breathing. Expand your stomach and inhale slowly through your nose. Hold your breath for 3-5 seconds. Then breathe out slowly, letting your stomach muscles relax.  Self-talk. Learn to notice and spot thought patterns that lead to anxiety reactions. Change those patterns to thoughts that feel peaceful.  Muscle relaxation. Take time to tense muscles and then relax them.  Choose a  tension-reduction technique that fits your lifestyle and personality. These techniques take time and practice. Set aside 5-15 minutes a day to do them. Specialized therapists can offer counseling and training in these techniques. The training to help with anxiety may be covered by some insurance plans.  Other things you can do to manage stress and anxiety include:  Keeping a stress diary. This can help you learn what triggers your reaction and then learn ways to manage your response.  Thinking about how you react to certain situations. You may not be able to control everything, but you can control your response.  Making time for activities that help you relax and not feeling guilty about spending your time in this way.  Doing visual imagery. This involves imagining or creating mental pictures to help you relax.  Practicing yoga. Through yoga poses, you can lower tension and relax.     Medicines  Medicines for anxiety include:  Antidepressant medicines. These are usually prescribed for long-term daily control.  Anti-anxiety medicines. These may be added in severe cases, especially when panic attacks occur.  When used together, medicines, psychotherapy, and tension-reduction techniques may be the most effective treatment.  Relationships  Relationships can play a big part in helping you recover. Spend more time connecting with trusted friends and family members. Think about going to couples counseling if you have a partner, taking family education classes, or going to family therapy. Therapy can help you and others better understand your anxiety.  How to recognize changes in  your anxiety  Everyone responds differently to treatment for anxiety. Recovery from anxiety happens when symptoms lessen and stop interfering with your daily life at home or work. This may mean that you will start to:  Have better concentration and focus. Worry will interfere less in your daily thinking.  Sleep better.  Be less irritable.  Have  more energy.  Have improved memory.  Try to recognize when your condition is getting worse. Contact your provider if your symptoms interfere with home or work and you feel like your condition is not improving.  Follow these instructions at home:  Activity  Exercise. Adults should:  Exercise for at least 150 minutes each week. The exercise should increase your heart rate and make you sweat (moderate-intensity exercise).  Do strengthening exercises at least twice a week.  Get the right amount and quality of sleep. Most adults need 7-9 hours of sleep each night.  Lifestyle    Eat a healthy diet that includes plenty of vegetables, fruits, whole grains, low-fat dairy products, and lean protein.  Do not eat a lot of foods that are high in fats, added sugars, or salt (sodium).  Make choices that simplify your life.  Do not use any products that contain nicotine or tobacco. These products include cigarettes, chewing tobacco, and vaping devices, such as e-cigarettes. If you need help quitting, ask your provider.  Avoid caffeine, alcohol, and certain over-the-counter cold medicines. These may make you feel worse. Ask your pharmacist which medicines to avoid.  General instructions  Take over-the-counter and prescription medicines only as told by your provider.  Keep all follow-up visits. This is to make sure you are managing your anxiety well or if you need more support.  Where to find support  You can get help and support from:  Self-help groups.  Online and [employer].  A trusted spiritual leader.  Couples counseling.  Family education classes.  Family therapy.  Where to find more information  You may find that joining a support group helps you deal with your anxiety. The following sources can help you find counselors or support groups near you:  Mental Health America: mentalhealthamerica.net  Anxiety and Depression Association of [place] (ADAA): adaa.org  [employer] Mental [employer]ness (NAMI):  nami.org  Contact a health care provider if:  You have a hard time staying focused or finishing tasks.  You spend many hours a day feeling worried about everyday life.  You are very tired because you cannot stop worrying.  You start to have headaches or often feel tense.  You have chronic nausea or diarrhea.  Get help right away if:  Your heart feels like it is racing.  You have shortness of breath.  You have thoughts of hurting yourself or others.  Get help right away if you feel like you may hurt yourself or others, or have thoughts about taking your own life. Go to your nearest emergency room or:  Call 911.  Call the National Suicide Prevention Lifeline at 2043122231 or 988. This is open 24 hours a day.  Text the Crisis Text Line at (832)170-7952.  This information is not intended to replace advice given to you by your health care provider. Make sure you discuss any questions you have with your health care provider.  Document Revised: 09/20/2022 Document Reviewed: 04/04/2021  Elsevier Patient Education  2024 [employer].

## 2024-02-22 ENCOUNTER — Ambulatory Visit (INDEPENDENT_AMBULATORY_CARE_PROVIDER_SITE_OTHER): Payer: 59 | Admitting: Nurse Practitioner

## 2024-02-22 ENCOUNTER — Encounter: Payer: Self-pay | Admitting: Nurse Practitioner

## 2024-02-22 VITALS — BP 123/78 | HR 60 | Temp 97.7°F | Ht 68.0 in | Wt 182.2 lb

## 2024-02-22 DIAGNOSIS — F320 Major depressive disorder, single episode, mild: Secondary | ICD-10-CM

## 2024-02-22 DIAGNOSIS — F419 Anxiety disorder, unspecified: Secondary | ICD-10-CM | POA: Diagnosis not present

## 2024-02-22 DIAGNOSIS — F17210 Nicotine dependence, cigarettes, uncomplicated: Secondary | ICD-10-CM

## 2024-02-22 DIAGNOSIS — N400 Enlarged prostate without lower urinary tract symptoms: Secondary | ICD-10-CM

## 2024-02-22 DIAGNOSIS — E782 Mixed hyperlipidemia: Secondary | ICD-10-CM | POA: Diagnosis not present

## 2024-02-22 DIAGNOSIS — K219 Gastro-esophageal reflux disease without esophagitis: Secondary | ICD-10-CM | POA: Diagnosis not present

## 2024-02-22 DIAGNOSIS — I10 Essential (primary) hypertension: Secondary | ICD-10-CM | POA: Diagnosis not present

## 2024-02-22 DIAGNOSIS — Z0000 Encounter for general adult medical examination without abnormal findings: Secondary | ICD-10-CM

## 2024-02-22 DIAGNOSIS — F5101 Primary insomnia: Secondary | ICD-10-CM

## 2024-02-22 LAB — URINALYSIS, ROUTINE W REFLEX MICROSCOPIC
Bilirubin, UA: NEGATIVE
Glucose, UA: NEGATIVE
Ketones, UA: NEGATIVE
Leukocytes,UA: NEGATIVE
Nitrite, UA: NEGATIVE
Protein,UA: NEGATIVE
RBC, UA: NEGATIVE
Specific Gravity, UA: >1.03 — ABNORMAL HIGH (ref 1.005–1.030)
Urobilinogen, Ur: 0.2 mg/dL (ref 0.2–1.0)
pH, UA: 6.5 (ref 5.0–7.5)

## 2024-02-22 MED ORDER — ATORVASTATIN CALCIUM 10 MG PO TABS: 10.0000 mg | ORAL_TABLET | Freq: Every day | ORAL | 4 refills | Status: AC

## 2024-02-22 MED ORDER — ESCITALOPRAM OXALATE 10 MG PO TABS: 10.0000 mg | ORAL_TABLET | Freq: Every day | ORAL | 2 refills | Status: DC

## 2024-02-22 NOTE — Assessment & Plan Note (Addendum)
 Chronic, ongoing.  Recommend heavy focus on diet changes, provided information on this, + cessation of smoking.  Will continue Protonix 20 MG daily as needed, reduce to discontinue in future.  Minimally uses.

## 2024-02-22 NOTE — Progress Notes (Signed)
 BP 123/78 (BP Location: Right Arm, Patient Position: Sitting, Cuff Size: Normal)   Pulse 60   Temp 97.7 F (36.5 C) (Oral)   Ht 5\' 8"  (1.727 m)   Wt 182 lb 3.2 oz (82.6 kg)   SpO2 98%   BMI 27.70 kg/m    Subjective:    Patient ID: Gavin Chaney, male    DOB: 10-13-1966, 58 y.o.   MRN: [id number]  HPI: Gavin Chaney is a 58 y.o. male presenting on 02/22/2024 for comprehensive medical examination. Current medical complaints include:none  He currently lives with: self Interim Problems from his last visit: no   HYPERTENSION / HYPERLIPIDEMIA Taking Atorvastatin. Continues to smoke <1 PPD, has been working on quitting.   BP monitoring frequency: not checking BP range:  BP medication side effects: no Duration of hyperlipidemia: chronic Cholesterol medication side effects: no Cholesterol supplements: none Medication compliance: good compliance Aspirin: no Recent stressors: no Recurrent headaches: no Visual changes: no Palpitations: no Dyspnea: no Chest pain: no Lower extremity edema: no Dizzy/lightheaded: no  The 10-year ASCVD risk score (Arnett DK, et al., 2019) is: 7.7%   Values used to calculate the score:     Age: 35 years     Sex: Male     Is Non-Hispanic African American: No     Diabetic: No     Tobacco smoker: Yes     Systolic Blood Pressure: 123 mmHg     Is BP treated: No     HDL Cholesterol: 61 mg/dL     Total Cholesterol: 166 mg/dL   GERD Occasional use of Protonix 20 MG. GERD control status: stable Satisfied with current treatment? yes Heartburn frequency: none Medication side effects: no  Medication compliance: stable Dysphagia: no Odynophagia:  no Hematemesis: no Blood in stool: no EGD: no   ANXIETY/STRESS Started on Lexapro 12/07/23.  Reports some benefit from this and no ADR -- but reports some nausea and backache recently. He reports his hands and legs are still tingly at times + legs will start sweating.  This is only  intermittent. Duration: stable Anxious mood: yes Excessive worrying: no Irritability: no Sweating: no Nausea: no Palpitations:no Hyperventilation: no Panic attacks: no Agoraphobia: no  Obscessions/compulsions: no Depressed mood: no    02/22/2024    9:05 AM 12/07/2023   11:09 AM 02/20/2023    9:45 AM 12/06/2021   11:40 AM 02/17/2021   11:07 AM  Depression screen PHQ 2/9  Decreased Interest 1 2 2 2 2   Down, Depressed, Hopeless 1 2 2 2 3   PHQ - 2 Score 2 4 4 4 5   Altered sleeping 2 3 2 3 3   Tired, decreased energy 2 3 2 3 3   Change in appetite 0 1 0 0 3  Feeling bad or failure about yourself  1 2 0 0 2  Trouble concentrating 0 2 0 1 2  Moving slowly or fidgety/restless 0 0 0 0 1  Suicidal thoughts 0 0  0 0  PHQ-9 Score 7 15 8 11 19   Difficult doing work/chores Somewhat difficult Somewhat difficult  Somewhat difficult Very difficult  Anhedonia: no Weight changes: no Insomnia: sleeping a little better Hypersomnia: no Fatigue/loss of energy: no Feelings of worthlessness: no Feelings of guilt: no Impaired concentration/indecisiveness: no Suicidal ideations: no  Crying spells: no Recent Stressors/Life Changes: yes -- separation   Relationship problems: no   Family stress: yes   Financial stress: no    Job stress: no   Recent death/loss: no  02/22/2024    9:04 AM 12/07/2023   11:09 AM 02/20/2023    9:46 AM 12/06/2021   11:40 AM  GAD 7 : Generalized Anxiety Score  Nervous, Anxious, on Edge 2 3 3 2   Control/stop worrying 1 2 2 2   Worry too much - different things 2 2 2 2   Trouble relaxing 1 2 2 2   Restless 2 2  2   Easily annoyed or irritable 2 1  2   Afraid - awful might happen 0 1 2 0  Total GAD 7 Score 10 13  12   Anxiety Difficulty Somewhat difficult Somewhat difficult Somewhat difficult Somewhat difficult   Functional Status Survey: Is the patient deaf or have difficulty hearing?: No Does the patient have difficulty seeing, even when wearing glasses/contacts?:  No Does the patient have difficulty concentrating, remembering, or making decisions?: No Does the patient have difficulty walking or climbing stairs?: No Does the patient have difficulty dressing or bathing?: No Does the patient have difficulty doing errands alone such as visiting a doctor's office or shopping?: No  FALL RISK:    02/22/2024    8:44 AM 12/07/2023   11:09 AM 02/20/2023    9:45 AM  Fall Risk   Falls in the past year? 0 0 0  Number falls in past yr:  0 0  Injury with Fall? 0 0 0  Risk for fall due to : No Fall Risks No Fall Risks No Fall Risks  Follow up Falls evaluation completed Falls evaluation completed    Past Medical History:  Past Medical History:  Diagnosis Date   Hyperlipidemia     Surgical History:  Past Surgical History:  Procedure Laterality Date   ROTATOR CUFF REPAIR     testcles surgery     VASECTOMY      Medications:  Current Outpatient Medications on File Prior to Visit  Medication Sig   fluticasone (FLONASE) 50 MCG/ACT nasal spray Place 2 sprays into both nostrils daily.   No current facility-administered medications on file prior to visit.    Allergies:  No Known Allergies  Social History:  Social History   Socioeconomic History   Marital status: Single    Spouse name: Not on file   Number of children: Not on file   Years of education: Not on file   Highest education level: Not on file  Occupational History   Not on file  Tobacco Use   Smoking status: Some Days    Current packs/day: 0.25    Types: Cigarettes   Smokeless tobacco: Never   Tobacco comments:    occasional  Vaping Use   Vaping status: Never Used  Substance and Sexual Activity   Alcohol use: Not Currently   Drug use: Not Currently   Sexual activity: Yes  Other Topics Concern   Not on file  Social History Narrative   Not on file   Social Drivers of Health   Financial Resource Strain: Low Risk  (02/22/2024)   Overall Financial Resource Strain (CARDIA)     Difficulty of Paying Living Expenses: Not hard at all  Food Insecurity: No Food Insecurity (02/22/2024)   Hunger Vital Sign    Worried About Running Out of Food in the Last Year: Never true    Ran Out of Food in the Last Year: Never true  Transportation Needs: No Transportation Needs (02/22/2024)   PRAPARE - Administrator, Civil Service (Medical): No    Lack of Transportation (Non-Medical): No  Physical Activity: Sufficiently  Active (02/22/2024)   Exercise Vital Sign    Days of Exercise per Week: 5 days    Minutes of Exercise per Session: 30 min  Stress: Stress Concern Present (02/22/2024)   [employer] of Occupational Health - Occupational Stress Questionnaire    Feeling of Stress : To some extent  Social Connections: Socially Isolated (02/22/2024)   Social Connection and Isolation Panel [NHANES]    Frequency of Communication with Friends and Family: Never    Frequency of Social Gatherings with Friends and Family: Never    Attends Religious Services: Never    Database administrator or Organizations: Yes    Attends Banker Meetings: Never    Marital Status: Never married  Intimate Partner Violence: Not At Risk (02/22/2024)   Humiliation, Afraid, Rape, and Kick questionnaire    Fear of Current or Ex-Partner: No    Emotionally Abused: No    Physically Abused: No    Sexually Abused: No   Social History   Tobacco Use  Smoking Status Some Days   Current packs/day: 0.25   Types: Cigarettes  Smokeless Tobacco Never  Tobacco Comments   occasional   Social History   Substance and Sexual Activity  Alcohol Use Not Currently    Family History:  Family History  Problem Relation Age of Onset   Hypertension Sister     Past medical history, surgical history, medications, allergies, family history and social history reviewed with patient today and changes made to appropriate areas of the chart.   Review of Systems - negative All other ROS negative  except what is listed above and in the HPI.      Objective:    BP 123/78 (BP Location: Right Arm, Patient Position: Sitting, Cuff Size: Normal)   Pulse 60   Temp 97.7 F (36.5 C) (Oral)   Ht 5\' 8"  (1.727 m)   Wt 182 lb 3.2 oz (82.6 kg)   SpO2 98%   BMI 27.70 kg/m   Wt Readings from Last 3 Encounters:  02/22/24 182 lb 3.2 oz (82.6 kg)  12/07/23 182 lb 11.2 oz (82.9 kg)  02/20/23 180 lb (81.6 kg)    Physical Exam Vitals and nursing note reviewed.  Constitutional:      General: He is awake. He is not in acute distress.    Appearance: He is well-developed and well-groomed. He is not ill-appearing or toxic-appearing.  HENT:     Head: Normocephalic and atraumatic.     Right Ear: Hearing, tympanic membrane, ear canal and external ear normal. No drainage.     Left Ear: Hearing, tympanic membrane, ear canal and external ear normal. No drainage.     Nose: Nose normal.     Mouth/Throat:     Pharynx: Uvula midline.  Eyes:     General: Lids are normal.        Right eye: No discharge.        Left eye: No discharge.     Extraocular Movements: Extraocular movements intact.     Conjunctiva/sclera: Conjunctivae normal.     Pupils: Pupils are equal, round, and reactive to light.     Visual Fields: Right eye visual fields normal and left eye visual fields normal.  Neck:     Thyroid: No thyromegaly.     Vascular: No carotid bruit or JVD.     Trachea: Trachea normal.  Cardiovascular:     Rate and Rhythm: Normal rate and regular rhythm.     Heart sounds: Normal  heart sounds, S1 normal and S2 normal. No murmur heard.    No gallop.  Pulmonary:     Effort: Pulmonary effort is normal. No accessory muscle usage or respiratory distress.     Breath sounds: Normal breath sounds.  Abdominal:     General: Bowel sounds are normal.     Palpations: Abdomen is soft. There is no hepatomegaly or splenomegaly.     Tenderness: There is no abdominal tenderness.  Musculoskeletal:        General: Normal  range of motion.     Cervical back: Normal range of motion and neck supple.     Right lower leg: No edema.     Left lower leg: No edema.  Lymphadenopathy:     Head:     Right side of head: No submental, submandibular, tonsillar, preauricular or posterior auricular adenopathy.     Left side of head: No submental, submandibular, tonsillar, preauricular or posterior auricular adenopathy.     Cervical: No cervical adenopathy.  Skin:    General: Skin is warm and dry.     Capillary Refill: Capillary refill takes less than 2 seconds.     Findings: No rash.  Neurological:     Mental Status: He is alert and oriented to person, place, and time.     Gait: Gait is intact.     Deep Tendon Reflexes: Reflexes are normal and symmetric.     Reflex Scores:      Brachioradialis reflexes are 2+ on the right side and 2+ on the left side.      Patellar reflexes are 2+ on the right side and 2+ on the left side. Psychiatric:        Attention and Perception: Attention normal.        Mood and Affect: Mood normal.        Speech: Speech normal.        Behavior: Behavior normal. Behavior is cooperative.        Thought Content: Thought content normal.        Cognition and Memory: Cognition normal.    Results for orders placed or performed in visit on 12/07/23  Comprehensive metabolic panel   Collection Time: 12/07/23 11:30 AM  Result Value Ref Range   Glucose 79 70 - 99 mg/dL   BUN 9 6 - 24 mg/dL   Creatinine, Ser 1.61 0.76 - 1.27 mg/dL   eGFR 096 >04 VW/UJW/1.19   BUN/Creatinine Ratio 11 9 - 20   Sodium 142 134 - 144 mmol/L   Potassium 4.1 3.5 - 5.2 mmol/L   Chloride 105 96 - 106 mmol/L   CO2 23 20 - 29 mmol/L   Calcium 9.7 8.7 - 10.2 mg/dL   Total Protein 7.1 6.0 - 8.5 g/dL   Albumin 4.6 3.8 - 4.9 g/dL   Globulin, Total 2.5 1.5 - 4.5 g/dL   Bilirubin Total 0.4 0.0 - 1.2 mg/dL   Alkaline Phosphatase 85 44 - 121 IU/L   AST 20 0 - 40 IU/L   ALT 26 0 - 44 IU/L  TSH   Collection Time: 12/07/23  11:30 AM  Result Value Ref Range   TSH 1.900 0.450 - 4.500 uIU/mL  Vitamin B12   Collection Time: 12/07/23 11:30 AM  Result Value Ref Range   Vitamin B-12 459 232 - 1,245 pg/mL  C-reactive protein   Collection Time: 12/07/23 11:30 AM  Result Value Ref Range   CRP <1 0 - 10 mg/L  Sed Rate (ESR)  Collection Time: 12/07/23 11:30 AM  Result Value Ref Range   Sed Rate 12 0 - 30 mm/hr      Assessment & Plan:   Problem List Items Addressed This Visit       Cardiovascular and Mediastinum   Essential hypertension   Chronic, stable.  No current medications.  Recommend he monitor BP at least a few mornings a week at home and document.  DASH diet at home.  Labs: CBC, CMP, TSH, UA.  Return in 6 months.       Relevant Medications   atorvastatin (LIPITOR) 10 MG tablet   Other Relevant Orders   CBC with Differential/Platelet   TSH   Urinalysis, Routine w reflex microscopic     Digestive   GERD without esophagitis   Chronic, ongoing.  Recommend heavy focus on diet changes, provided information on this, + cessation of smoking.  Will continue Protonix 20 MG daily as needed, reduce to discontinue in future.  Minimally uses.        Other   Anxiety   Refer to depression plan of care.      Relevant Medications   escitalopram (LEXAPRO) 10 MG tablet   Depression, major, single episode, mild (HCC) - Primary   Ongoing with anxiety.  Denies SI/HI.  No guns in home.  He is aware if SI were to present to immediately go to ER or reach out to provider.  At this time will increase Lexapro to 10 MG daily, educated him on this and side effects + BLACK BOX warning -- he is noticing some benefit from 5 MG.  Explained to them that drugs of the SSRI class can have side effects such as weight gain, sexual dysfunction, insomnia, headache, nausea + can take 6 weeks to get to full potential of benefit. To return in 6 weeks.      Relevant Medications   escitalopram (LEXAPRO) 10 MG tablet   Hyperlipidemia    Chronic, ongoing.  Continue current medication regimen and adjust as needed.  Lipid panel and CMP today.  Refills sent.       Relevant Medications   atorvastatin (LIPITOR) 10 MG tablet   Other Relevant Orders   Comprehensive metabolic panel   Lipid Panel w/o Chol/HDL Ratio   Nicotine dependence, cigarettes, uncomplicated   I have recommended complete cessation of tobacco use. I have discussed various options available for assistance with tobacco cessation including over the counter methods (Nicotine gum, patch and lozenges). We also discussed prescription options (Chantix, Nicotine Inhaler / Nasal Spray). The patient is not interested in pursuing any prescription tobacco cessation options at this time.  Refuses lung cancer screening at this time.       Other Visit Diagnoses       Benign prostatic hyperplasia without lower urinary tract symptoms       PSA on labs today.   Relevant Orders   PSA     Encounter for annual physical exam       Annual physical today with labs and health maintenance reviewed, discussed with patient.       Discussed aspirin prophylaxis for myocardial infarction prevention and decision was it was not indicated  LABORATORY TESTING:  Health maintenance labs ordered today as discussed above.   The natural history of prostate cancer and ongoing controversy regarding screening and potential treatment outcomes of prostate cancer has been discussed with the patient. The meaning of a false positive PSA and a false negative PSA has been discussed. He indicates  understanding of the limitations of this screening test and wishes  to proceed with screening PSA testing.   IMMUNIZATIONS:   - Tetanus vaccination status reviewed: last tetanus booster within 10 years. - Influenza: Refused - Pneumovax: Not applicable - Prevnar: Not applicable - Zostavax vaccine: Refused  - Covid vaccine: Refused  SCREENING: - Colonoscopy: Cologuard due next 02/26/2026 Discussed with  patient purpose of the colonoscopy is to detect colon cancer at curable precancerous or early stages   - AAA Screening: Not applicable  -Hearing Test: Not applicable  -Spirometry: Not applicable   PATIENT COUNSELING:    Sexuality: Discussed sexually transmitted diseases, partner selection, use of condoms, avoidance of unintended pregnancy  and contraceptive alternatives.   Advised to avoid cigarette smoking.  I discussed with the patient that most people either abstain from alcohol or drink within safe limits (<=14/week and <=4 drinks/occasion for males, <=7/weeks and <= 3 drinks/occasion for females) and that the risk for alcohol disorders and other health effects rises proportionally with the number of drinks per week and how often a drinker exceeds daily limits.  Discussed cessation/primary prevention of drug use and availability of treatment for abuse.   Diet: Encouraged to adjust caloric intake to maintain  or achieve ideal body weight, to reduce intake of dietary saturated fat and total fat, to limit sodium intake by avoiding high sodium foods and not adding table salt, and to maintain adequate dietary potassium and calcium preferably from fresh fruits, vegetables, and low-fat dairy products.    Stressed the importance of regular exercise  Injury prevention: Discussed safety belts, safety helmets, smoke detector, smoking near bedding or upholstery.   Dental health: Discussed importance of regular tooth brushing, flossing, and dental visits.   Follow up plan: NEXT PREVENTATIVE PHYSICAL DUE IN 1 YEAR. Return in about 6 weeks (around 04/04/2024) for ANXIETY - increase in Lexapro to 10 MG.

## 2024-02-22 NOTE — Assessment & Plan Note (Signed)
 Ongoing with anxiety.  Denies SI/HI.  No guns in home.  He is aware if SI were to present to immediately go to ER or reach out to provider.  At this time will increase Lexapro to 10 MG daily, educated him on this and side effects + BLACK BOX warning -- he is noticing some benefit from 5 MG.  Explained to them that drugs of the SSRI class can have side effects such as weight gain, sexual dysfunction, insomnia, headache, nausea + can take 6 weeks to get to full potential of benefit. To return in 6 weeks.

## 2024-02-22 NOTE — Assessment & Plan Note (Signed)
I have recommended complete cessation of tobacco use. I have discussed various options available for assistance with tobacco cessation including over the counter methods (Nicotine gum, patch and lozenges). We also discussed prescription options (Chantix, Nicotine Inhaler / Nasal Spray). The patient is not interested in pursuing any prescription tobacco cessation options at this time.  Refuses lung cancer screening at this time.

## 2024-02-22 NOTE — Assessment & Plan Note (Signed)
Chronic, ongoing.  Continue current medication regimen and adjust as needed.  Lipid panel and CMP today.  Refills sent.  

## 2024-02-22 NOTE — Assessment & Plan Note (Signed)
 Refer to depression plan of care.

## 2024-02-22 NOTE — Assessment & Plan Note (Addendum)
 Chronic, stable.  No current medications.  Recommend he monitor BP at least a few mornings a week at home and document.  DASH diet at home.  Labs: CBC, CMP, TSH, UA.  Return in 6 months.

## 2024-02-22 NOTE — Progress Notes (Signed)
 Please let Gavin Chaney know his urine shows no infection.  Very good news.

## 2024-02-23 LAB — CBC WITH DIFFERENTIAL/PLATELET
Basophils Absolute: 0.1 10*3/uL (ref 0.0–0.2)
Basos: 1 %
EOS (ABSOLUTE): 0.1 10*3/uL (ref 0.0–0.4)
Eos: 1 %
Hematocrit: 45.6 % (ref 37.5–51.0)
Hemoglobin: 15.3 g/dL (ref 13.0–17.7)
Immature Grans (Abs): 0 10*3/uL (ref 0.0–0.1)
Immature Granulocytes: 0 %
Lymphocytes Absolute: 2.2 10*3/uL (ref 0.7–3.1)
Lymphs: 29 %
MCH: 29.1 pg (ref 26.6–33.0)
MCHC: 33.6 g/dL (ref 31.5–35.7)
MCV: 87 fL (ref 79–97)
Monocytes Absolute: 0.6 10*3/uL (ref 0.1–0.9)
Monocytes: 8 %
Neutrophils Absolute: 4.5 10*3/uL (ref 1.4–7.0)
Neutrophils: 61 %
Platelets: 232 10*3/uL (ref 150–450)
RBC: 5.25 x10E6/uL (ref 4.14–5.80)
RDW: 13 % (ref 11.6–15.4)
WBC: 7.5 10*3/uL (ref 3.4–10.8)

## 2024-02-23 LAB — LIPID PANEL W/O CHOL/HDL RATIO
Cholesterol, Total: 175 mg/dL (ref 100–199)
HDL: 62 mg/dL (ref >39)
LDL Chol Calc (NIH): 93 mg/dL (ref 0–99)
Triglycerides: 115 mg/dL (ref 0–149)
VLDL Cholesterol Cal: 20 mg/dL (ref 5–40)

## 2024-02-23 LAB — PSA: Prostate Specific Ag, Serum: 0.4 ng/mL (ref 0.0–4.0)

## 2024-02-23 LAB — COMPREHENSIVE METABOLIC PANEL
ALT: 31 IU/L (ref 0–44)
AST: 21 IU/L (ref 0–40)
Albumin: 4.3 g/dL (ref 3.8–4.9)
Alkaline Phosphatase: 95 IU/L (ref 44–121)
BUN/Creatinine Ratio: 16 (ref 9–20)
BUN: 12 mg/dL (ref 6–24)
Bilirubin Total: 0.3 mg/dL (ref 0.0–1.2)
CO2: 23 mmol/L (ref 20–29)
Calcium: 9.1 mg/dL (ref 8.7–10.2)
Chloride: 105 mmol/L (ref 96–106)
Creatinine, Ser: 0.75 mg/dL — ABNORMAL LOW (ref 0.76–1.27)
Globulin, Total: 2.5 g/dL (ref 1.5–4.5)
Glucose: 86 mg/dL (ref 70–99)
Potassium: 4.2 mmol/L (ref 3.5–5.2)
Sodium: 141 mmol/L (ref 134–144)
Total Protein: 6.8 g/dL (ref 6.0–8.5)
eGFR: 105 mL/min/{1.73_m2} (ref >59)

## 2024-02-23 LAB — TSH: TSH: 1.76 u[IU]/mL (ref 0.450–4.500)

## 2024-02-23 NOTE — Progress Notes (Signed)
 Good morning, please let Eban know labs have returned and overall are stable and normal ranges.  Continue all current medications as ordered.  Any questions? Keep being stellar!!  Thank you for allowing me to participate in your care.  I appreciate you. Kindest regards, Lonette Stevison

## 2024-02-27 ENCOUNTER — Telehealth: Payer: Self-pay

## 2024-02-27 NOTE — Telephone Encounter (Signed)
 Reviewed lab result note from the provider. Called and LVM notifying patient of lab result message from [place].    Copied from CRM [id number]. Topic: Clinical - Lab/Test Results >> Feb 27, 2024 11:49 AM Higinio Roger wrote: Reason for CRM: Patient is requesting a callback to discuss lab results. Patient states it is okay to leave a voicemail. Callback #: [phone]

## 2024-03-03 ENCOUNTER — Other Ambulatory Visit: Payer: Self-pay | Admitting: Nurse Practitioner

## 2024-04-04 ENCOUNTER — Ambulatory Visit: Payer: 59 | Admitting: Nurse Practitioner

## 2024-09-11 ENCOUNTER — Encounter: Payer: Self-pay | Admitting: Family Medicine

## 2024-09-11 ENCOUNTER — Ambulatory Visit (INDEPENDENT_AMBULATORY_CARE_PROVIDER_SITE_OTHER): Admitting: Family Medicine

## 2024-09-11 VITALS — BP 139/88 | HR 75 | Ht 67.5 in | Wt 183.6 lb

## 2024-09-11 DIAGNOSIS — R1011 Right upper quadrant pain: Secondary | ICD-10-CM | POA: Diagnosis not present

## 2024-09-11 MED ORDER — SUCRALFATE 1 G PO TABS: 1.0000 g | ORAL_TABLET | Freq: Three times a day (TID) | ORAL | 0 refills | Status: AC

## 2024-09-11 NOTE — Progress Notes (Signed)
 BP 139/88 (BP Location: Left Arm, Patient Position: Sitting, Cuff Size: Large)   Pulse 75   Ht 5' 7.5 (1.715 m)   Wt 183 lb 9.6 oz (83.3 kg)   SpO2 95%   BMI 28.33 kg/m    Subjective:    Patient ID: Gavin Chaney, male    DOB: March 06, 1966, 58 y.o.   MRN: [id number]  HPI: Gavin Chaney is a 58 y.o. male  Chief Complaint  Patient presents with   Shoulder Pain    Pain starts on the right side chest area and radiates to the shoulder    Gastroesophageal Reflux    Has a pain in the right side abdomen, feels as if he has to throw up but wont come out   ABDOMINAL PAIN  Duration: 4 days  Onset: sudden Severity: severe Quality: bloating, and reflux, nauseous Location:  epigastric and chest and into his R shoulder  Episode duration: constant Radiation: into his chest and shoulder blade Frequency: constant Alleviating factors: nothing Aggravating factors: nothing Status: worse Treatments attempted: antacids Fever: no Nausea: yes Vomiting: no Weight loss: no Decreased appetite: no Diarrhea: no Constipation: no Blood in stool: no Heartburn: yes Jaundice: no Rash: no Dysuria/urinary frequency: no Hematuria: no History of sexually transmitted disease: no Recurrent NSAID use: no  Relevant past medical, surgical, family and social history reviewed and updated as indicated. Interim medical history since our last visit reviewed. Allergies and medications reviewed and updated.  Review of Systems  Constitutional: Negative.   Respiratory: Negative.    Cardiovascular: Negative.   Gastrointestinal:  Positive for abdominal distention, abdominal pain and nausea. Negative for anal bleeding, blood in stool, constipation, diarrhea, rectal pain and vomiting.  Musculoskeletal:  Positive for myalgias. Negative for arthralgias, back pain, gait problem, joint swelling, neck pain and neck stiffness.  Skin: Negative.   Neurological: Negative.   Psychiatric/Behavioral: Negative.       Per HPI unless specifically indicated above     Objective:    BP 139/88 (BP Location: Left Arm, Patient Position: Sitting, Cuff Size: Large)   Pulse 75   Ht 5' 7.5 (1.715 m)   Wt 183 lb 9.6 oz (83.3 kg)   SpO2 95%   BMI 28.33 kg/m   Wt Readings from Last 3 Encounters:  09/11/24 183 lb 9.6 oz (83.3 kg)  02/22/24 182 lb 3.2 oz (82.6 kg)  12/07/23 182 lb 11.2 oz (82.9 kg)    Physical Exam Vitals and nursing note reviewed.  Constitutional:      General: He is not in acute distress.    Appearance: Normal appearance. He is not ill-appearing, toxic-appearing or diaphoretic.  HENT:     Head: Normocephalic and atraumatic.     Right Ear: External ear normal.     Left Ear: External ear normal.     Nose: Nose normal.     Mouth/Throat:     Mouth: Mucous membranes are moist.     Pharynx: Oropharynx is clear.  Eyes:     General: No scleral icterus.       Right eye: No discharge.        Left eye: No discharge.     Extraocular Movements: Extraocular movements intact.     Conjunctiva/sclera: Conjunctivae normal.     Pupils: Pupils are equal, round, and reactive to light.  Cardiovascular:     Rate and Rhythm: Normal rate and regular rhythm.     Pulses: Normal pulses.     Heart sounds: Normal heart  sounds. No murmur heard.    No friction rub. No gallop.  Pulmonary:     Effort: Pulmonary effort is normal. No respiratory distress.     Breath sounds: Normal breath sounds. No stridor. No wheezing, rhonchi or rales.  Chest:     Chest wall: No tenderness.  Abdominal:     General: Abdomen is flat. Bowel sounds are normal. There is no distension.     Palpations: Abdomen is soft. There is no mass.     Tenderness: There is abdominal tenderness (generalized, mild). There is no right CVA tenderness, left CVA tenderness, guarding or rebound.     Hernia: No hernia is present.  Musculoskeletal:        General: Normal range of motion.     Cervical back: Normal range of motion and neck  supple.  Skin:    General: Skin is warm and dry.     Capillary Refill: Capillary refill takes less than 2 seconds.     Coloration: Skin is not jaundiced or pale.     Findings: No bruising, erythema, lesion or rash.  Neurological:     General: No focal deficit present.     Mental Status: He is alert and oriented to person, place, and time. Mental status is at baseline.  Psychiatric:        Mood and Affect: Mood normal.        Behavior: Behavior normal.        Thought Content: Thought content normal.        Judgment: Judgment normal.     Results for orders placed or performed in visit on 02/22/24  CBC with Differential/Platelet   Collection Time: 02/22/24  9:18 AM  Result Value Ref Range   WBC 7.5 3.4 - 10.8 x10E3/uL   RBC 5.25 4.14 - 5.80 x10E6/uL   Hemoglobin 15.3 13.0 - 17.7 g/dL   Hematocrit 54.3 62.4 - 51.0 %   MCV 87 79 - 97 fL   MCH 29.1 26.6 - 33.0 pg   MCHC 33.6 31.5 - 35.7 g/dL   RDW 86.9 88.3 - 84.5 %   Platelets 232 150 - 450 x10E3/uL   Neutrophils 61 Not Estab. %   Lymphs 29 Not Estab. %   Monocytes 8 Not Estab. %   Eos 1 Not Estab. %   Basos 1 Not Estab. %   Neutrophils Absolute 4.5 1.4 - 7.0 x10E3/uL   Lymphocytes Absolute 2.2 0.7 - 3.1 x10E3/uL   Monocytes Absolute 0.6 0.1 - 0.9 x10E3/uL   EOS (ABSOLUTE) 0.1 0.0 - 0.4 x10E3/uL   Basophils Absolute 0.1 0.0 - 0.2 x10E3/uL   Immature Granulocytes 0 Not Estab. %   Immature Grans (Abs) 0.0 0.0 - 0.1 x10E3/uL  Comprehensive metabolic panel   Collection Time: 02/22/24  9:18 AM  Result Value Ref Range   Glucose 86 70 - 99 mg/dL   BUN 12 6 - 24 mg/dL   Creatinine, Ser 9.24 (L) 0.76 - 1.27 mg/dL   eGFR 894 >40 fO/fpw/8.26   BUN/Creatinine Ratio 16 9 - 20   Sodium 141 134 - 144 mmol/L   Potassium 4.2 3.5 - 5.2 mmol/L   Chloride 105 96 - 106 mmol/L   CO2 23 20 - 29 mmol/L   Calcium  9.1 8.7 - 10.2 mg/dL   Total Protein 6.8 6.0 - 8.5 g/dL   Albumin 4.3 3.8 - 4.9 g/dL   Globulin, Total 2.5 1.5 - 4.5 g/dL    Bilirubin Total 0.3 0.0 - 1.2 mg/dL  Alkaline Phosphatase 95 44 - 121 IU/L   AST 21 0 - 40 IU/L   ALT 31 0 - 44 IU/L  TSH   Collection Time: 02/22/24  9:18 AM  Result Value Ref Range   TSH 1.760 0.450 - 4.500 uIU/mL  Lipid Panel w/o Chol/HDL Ratio   Collection Time: 02/22/24  9:18 AM  Result Value Ref Range   Cholesterol, Total 175 100 - 199 mg/dL   Triglycerides 884 0 - 149 mg/dL   HDL 62 >60 mg/dL   VLDL Cholesterol Cal 20 5 - 40 mg/dL   LDL Chol Calc (NIH) 93 0 - 99 mg/dL  PSA   Collection Time: 02/22/24  9:18 AM  Result Value Ref Range   Prostate Specific Ag, Serum 0.4 0.0 - 4.0 ng/mL  Urinalysis, Routine w reflex microscopic   Collection Time: 02/22/24  9:18 AM  Result Value Ref Range   Specific Gravity, UA >1.030 (H) 1.005 - 1.030   pH, UA 6.5 5.0 - 7.5   Color, UA Yellow Yellow   Appearance Ur Clear Clear   Leukocytes,UA Negative Negative   Protein,UA Negative Negative/Trace   Glucose, UA Negative Negative   Ketones, UA Negative Negative   RBC, UA Negative Negative   Bilirubin, UA Negative Negative   Urobilinogen, Ur 0.2 0.2 - 1.0 mg/dL   Nitrite, UA Negative Negative   Microscopic Examination Comment       Assessment & Plan:   Problem List Items Addressed This Visit   None Visit Diagnoses       RUQ pain    -  Primary   GERD vs gall stones. EKG normal. Will check labs and RUQ US . Start carafate . Await results. Follow up with PCP in 2 weeks.   Relevant Orders   EKG 12-Lead   US  Abdomen Limited RUQ (LIVER/GB)   CBC with Differential/Platelet   Comprehensive metabolic panel with GFR   Amylase   Lipase        Follow up plan: Return in about 2 weeks (around 09/25/2024) for with PCP.

## 2024-09-12 ENCOUNTER — Telehealth: Payer: Self-pay

## 2024-09-12 ENCOUNTER — Ambulatory Visit
Admission: RE | Admit: 2024-09-12 | Discharge: 2024-09-12 | Disposition: A | Discharge status: Home / Self Care | Source: Ambulatory Visit | Attending: Family Medicine | Admitting: Family Medicine

## 2024-09-12 ENCOUNTER — Ambulatory Visit: Payer: Self-pay | Admitting: Family Medicine

## 2024-09-12 ENCOUNTER — Encounter: Payer: Self-pay | Admitting: Family Medicine

## 2024-09-12 ENCOUNTER — Ambulatory Visit (INDEPENDENT_AMBULATORY_CARE_PROVIDER_SITE_OTHER): Admitting: Family Medicine

## 2024-09-12 ENCOUNTER — Other Ambulatory Visit: Payer: Self-pay | Admitting: Family Medicine

## 2024-09-12 VITALS — BP 123/85 | HR 65 | Temp 98.1°F | Resp 15 | Ht 67.52 in | Wt 183.0 lb

## 2024-09-12 DIAGNOSIS — R932 Abnormal findings on diagnostic imaging of liver and biliary tract: Secondary | ICD-10-CM

## 2024-09-12 DIAGNOSIS — B029 Zoster without complications: Secondary | ICD-10-CM | POA: Diagnosis not present

## 2024-09-12 DIAGNOSIS — R1011 Right upper quadrant pain: Secondary | ICD-10-CM | POA: Insufficient documentation

## 2024-09-12 LAB — COMPREHENSIVE METABOLIC PANEL WITH GFR
ALT: 24 IU/L (ref 0–44)
AST: 19 IU/L (ref 0–40)
Albumin: 4.6 g/dL (ref 3.8–4.9)
Alkaline Phosphatase: 89 IU/L (ref 47–123)
BUN/Creatinine Ratio: 14 (ref 9–20)
BUN: 12 mg/dL (ref 6–24)
Bilirubin Total: 0.3 mg/dL (ref 0.0–1.2)
CO2: 21 mmol/L (ref 20–29)
Calcium: 9.4 mg/dL (ref 8.7–10.2)
Chloride: 103 mmol/L (ref 96–106)
Creatinine, Ser: 0.84 mg/dL (ref 0.76–1.27)
Globulin, Total: 2.3 g/dL (ref 1.5–4.5)
Glucose: 88 mg/dL (ref 70–99)
Potassium: 4.2 mmol/L (ref 3.5–5.2)
Sodium: 138 mmol/L (ref 134–144)
Total Protein: 6.9 g/dL (ref 6.0–8.5)
eGFR: 102 mL/min/1.73 (ref >59)

## 2024-09-12 LAB — CBC WITH DIFFERENTIAL/PLATELET
Basophils Absolute: 0 x10E3/uL (ref 0.0–0.2)
Basos: 1 %
EOS (ABSOLUTE): 0.1 x10E3/uL (ref 0.0–0.4)
Eos: 1 %
Hematocrit: 47.2 % (ref 37.5–51.0)
Hemoglobin: 14.9 g/dL (ref 13.0–17.7)
Immature Grans (Abs): 0 x10E3/uL (ref 0.0–0.1)
Immature Granulocytes: 0 %
Lymphocytes Absolute: 1.7 x10E3/uL (ref 0.7–3.1)
Lymphs: 24 %
MCH: 28.1 pg (ref 26.6–33.0)
MCHC: 31.6 g/dL (ref 31.5–35.7)
MCV: 89 fL (ref 79–97)
Monocytes Absolute: 0.8 x10E3/uL (ref 0.1–0.9)
Monocytes: 11 %
Neutrophils Absolute: 4.6 x10E3/uL (ref 1.4–7.0)
Neutrophils: 62 %
Platelets: 223 x10E3/uL (ref 150–450)
RBC: 5.3 x10E6/uL (ref 4.14–5.80)
RDW: 13.1 % (ref 11.6–15.4)
WBC: 7.3 x10E3/uL (ref 3.4–10.8)

## 2024-09-12 LAB — LIPASE: Lipase: 28 U/L (ref 13–78)

## 2024-09-12 LAB — AMYLASE: Amylase: 42 U/L (ref 31–110)

## 2024-09-12 MED ORDER — VALACYCLOVIR HCL 1 G PO TABS: 1000.0000 mg | ORAL_TABLET | Freq: Three times a day (TID) | ORAL | 0 refills | Status: AC

## 2024-09-12 MED ORDER — PREDNISONE 10 MG PO TABS: ORAL_TABLET | ORAL | 0 refills | Status: AC

## 2024-09-12 MED ORDER — TRIAMCINOLONE ACETONIDE 40 MG/ML IJ SUSP
40.0000 mg | Freq: Once | INTRAMUSCULAR | Status: AC
  Administered 2024-09-12: 40 mg via INTRAMUSCULAR

## 2024-09-12 NOTE — Telephone Encounter (Signed)
 See result notes from provider.   Copied from CRM [id number]. Topic: Clinical - Lab/Test Results >> Sep 12, 2024  9:55 AM Winona R wrote: Pt returning call about his labs, however there are no notes attached to results.

## 2024-09-12 NOTE — Progress Notes (Signed)
 BP 123/85 (BP Location: Left Arm, Patient Position: Sitting, Cuff Size: Normal)   Pulse 65   Temp 98.1 F (36.7 C) (Oral)   Resp 15   Ht 5' 7.52 (1.715 m)   Wt 183 lb (83 kg)   SpO2 98%   BMI 28.22 kg/m    Subjective:    Patient ID: Gavin Chaney, male    DOB: [birth year], 58 y.o.   MRN: [id number]  HPI: Gavin Chaney is a 58 y.o. male  Chief Complaint  Patient presents with   Rash   RASH Duration:  this morning  Location: R chest and back  Itching: no Burning: yes Redness: no Oozing: yes Scaling: yes Blisters: yes Painful: yes Fevers: no Change in detergents/soaps/personal care products: no Recent illness: no Recent travel:no History of same: no Context: worse Alleviating factors: nothing Treatments attempted:nothing Shortness of breath: no  Throat/tongue swelling: no Myalgias/arthralgias: no  Relevant past medical, surgical, family and social history reviewed and updated as indicated. Interim medical history since our last visit reviewed. Allergies and medications reviewed and updated.  Review of Systems  Constitutional: Negative.   Respiratory: Negative.    Cardiovascular: Negative.   Musculoskeletal: Negative.   Skin:  Positive for rash. Negative for color change, pallor and wound.  Psychiatric/Behavioral: Negative.      Per HPI unless specifically indicated above     Objective:    BP 123/85 (BP Location: Left Arm, Patient Position: Sitting, Cuff Size: Normal)   Pulse 65   Temp 98.1 F (36.7 C) (Oral)   Resp 15   Ht 5' 7.52 (1.715 m)   Wt 183 lb (83 kg)   SpO2 98%   BMI 28.22 kg/m   Wt Readings from Last 3 Encounters:  09/12/24 183 lb (83 kg)  09/11/24 183 lb 9.6 oz (83.3 kg)  02/22/24 182 lb 3.2 oz (82.6 kg)    Physical Exam Vitals and nursing note reviewed.  Constitutional:      General: He is not in acute distress.    Appearance: Normal appearance. He is well-developed.  HENT:     Head: Normocephalic and atraumatic.      Right Ear: Hearing and external ear normal.     Left Ear: Hearing and external ear normal.     Nose: Nose normal.     Mouth/Throat:     Mouth: Mucous membranes are moist.     Pharynx: Oropharynx is clear.  Eyes:     General: Lids are normal. No scleral icterus.       Right eye: No discharge.        Left eye: No discharge.     Conjunctiva/sclera: Conjunctivae normal.  Pulmonary:     Effort: Pulmonary effort is normal. No respiratory distress.  Musculoskeletal:        General: Normal range of motion.  Skin:    Coloration: Skin is not jaundiced or pale.     Findings: Rash (veiscular rash on R chest and back) present. No bruising, erythema or lesion.  Neurological:     General: No focal deficit present.     Mental Status: He is alert and oriented to person, place, and time. Mental status is at baseline.  Psychiatric:        Mood and Affect: Mood normal.        Speech: Speech normal.        Behavior: Behavior normal.        Thought Content: Thought content normal.  Judgment: Judgment normal.     Results for orders placed or performed in visit on 09/11/24  CBC with Differential/Platelet   Collection Time: 09/11/24 11:30 AM  Result Value Ref Range   WBC 7.3 3.4 - 10.8 x10E3/uL   RBC 5.30 4.14 - 5.80 x10E6/uL   Hemoglobin 14.9 13.0 - 17.7 g/dL   Hematocrit 52.7 62.4 - 51.0 %   MCV 89 79 - 97 fL   MCH 28.1 26.6 - 33.0 pg   MCHC 31.6 31.5 - 35.7 g/dL   RDW 86.8 88.3 - 84.5 %   Platelets 223 150 - 450 x10E3/uL   Neutrophils 62 Not Estab. %   Lymphs 24 Not Estab. %   Monocytes 11 Not Estab. %   Eos 1 Not Estab. %   Basos 1 Not Estab. %   Neutrophils Absolute 4.6 1.4 - 7.0 x10E3/uL   Lymphocytes Absolute 1.7 0.7 - 3.1 x10E3/uL   Monocytes Absolute 0.8 0.1 - 0.9 x10E3/uL   EOS (ABSOLUTE) 0.1 0.0 - 0.4 x10E3/uL   Basophils Absolute 0.0 0.0 - 0.2 x10E3/uL   Immature Granulocytes 0 Not Estab. %   Immature Grans (Abs) 0.0 0.0 - 0.1 x10E3/uL  Comprehensive metabolic  panel with GFR   Collection Time: 09/11/24 11:30 AM  Result Value Ref Range   Glucose 88 70 - 99 mg/dL   BUN 12 6 - 24 mg/dL   Creatinine, Ser 9.15 0.76 - 1.27 mg/dL   eGFR 897 >40 fO/fpw/8.26   BUN/Creatinine Ratio 14 9 - 20   Sodium 138 134 - 144 mmol/L   Potassium 4.2 3.5 - 5.2 mmol/L   Chloride 103 96 - 106 mmol/L   CO2 21 20 - 29 mmol/L   Calcium  9.4 8.7 - 10.2 mg/dL   Total Protein 6.9 6.0 - 8.5 g/dL   Albumin 4.6 3.8 - 4.9 g/dL   Globulin, Total 2.3 1.5 - 4.5 g/dL   Bilirubin Total 0.3 0.0 - 1.2 mg/dL   Alkaline Phosphatase 89 47 - 123 IU/L   AST 19 0 - 40 IU/L   ALT 24 0 - 44 IU/L  Amylase   Collection Time: 09/11/24 11:30 AM  Result Value Ref Range   Amylase 42 31 - 110 U/L  Lipase   Collection Time: 09/11/24 11:30 AM  Result Value Ref Range   Lipase 28 13 - 78 U/L      Assessment & Plan:   Problem List Items Addressed This Visit   None Visit Diagnoses       Herpes zoster without complication    -  Primary   Will treat with prednisone  and valtrex . Call with any concerns or if not getting better.   Relevant Medications   valACYclovir  (VALTREX ) 1000 MG tablet   triamcinolone  acetonide (KENALOG -40) injection 40 mg        Follow up plan: Return for As scheduled.

## 2024-09-12 NOTE — Telephone Encounter (Signed)
 Patient seen by Dr. Vicci.   Copied from CRM [id number]. Topic: Clinical - Lab/Test Results >> Sep 12, 2024 10:04 AM Turkey B wrote: Reason for CRM: Patient returned for imaging results of gallbladder. Please cb

## 2024-09-23 ENCOUNTER — Ambulatory Visit: Admission: RE | Admit: 2024-09-23 | Source: Ambulatory Visit

## 2024-09-23 ENCOUNTER — Other Ambulatory Visit: Payer: Self-pay | Admitting: Family Medicine

## 2024-09-23 DIAGNOSIS — R932 Abnormal findings on diagnostic imaging of liver and biliary tract: Secondary | ICD-10-CM

## 2024-09-27 ENCOUNTER — Ambulatory Visit (INDEPENDENT_AMBULATORY_CARE_PROVIDER_SITE_OTHER): Admitting: Nurse Practitioner

## 2024-09-27 ENCOUNTER — Encounter: Payer: Self-pay | Admitting: Nurse Practitioner

## 2024-09-27 VITALS — BP 129/79 | HR 66 | Temp 98.6°F | Resp 15 | Ht 66.0 in | Wt 180.2 lb

## 2024-09-27 DIAGNOSIS — R932 Abnormal findings on diagnostic imaging of liver and biliary tract: Secondary | ICD-10-CM | POA: Diagnosis not present

## 2024-09-27 DIAGNOSIS — B029 Zoster without complications: Secondary | ICD-10-CM | POA: Insufficient documentation

## 2024-09-27 DIAGNOSIS — K7460 Unspecified cirrhosis of liver: Secondary | ICD-10-CM | POA: Insufficient documentation

## 2024-09-27 NOTE — Assessment & Plan Note (Signed)
 Acute and improving.  He will continue Tylenol for pain, reports it is improving.

## 2024-09-27 NOTE — Patient Instructions (Signed)
Nerve Pain After Shingles Postherpetic neuralgia (PHN) is nerve pain you may get after you have shingles. Shingles is an infection that causes a painful rash and blisters. It's caused by the same germ that causes chickenpox. PHN affects the spot on your body where you had the shingles rash. It can last for 3 months after your rash has gone away. What are the causes? PHN may be caused by damage to your nerves. This damage may come from swelling from the shingles infection. What increases the risk? You may be more likely to get PHN if: You're older than 58 years of age. You have severe pain before your rash starts. You have a very bad rash. You have shingles in and around your eye. Your body defense system (immune system) is weak. What are the signs or symptoms? The main symptom of PHN is pain. The pain may: Be stabbing, burning, or shooting. Feel like an electric shock. Come and go, or it may be there all the time. Get worse if: Something touches your skin. The temperature goes up or down. You may also have itching. How is this diagnosed? PHN may be diagnosed based on: Your symptoms. Whether you've had shingles before. How is this treated? There's no cure, but treatment can help with the pain. Normal pain medicines may not help. You may need to work with an expert in treating pain to find what works best for you. Treatment may include: Anti-seizure medicines. Antidepressants. Strong pain medicines. A numbing patch worn on the skin. Shots of: Numbing medicines. Medicines to treat inflammation. Botulinum toxin. This can block pain signals and stop you from feeling pain. Follow these instructions at home: Medicines Take over-the-counter and prescription medicines only as told by your health care provider. Ask your provider if the medicine prescribed to you: Requires you to avoid driving or using machinery. Can cause trouble pooping or constipation. You may need to take these  actions to prevent or treat trouble pooping: Drink enough fluid to keep your pee (urine) pale yellow. Take over-the-counter or prescription medicines. Eat foods that are high in fiber, such as beans, whole grains, and fresh fruits and vegetables. Limit foods that are high in fat and processed sugars, such as fried or sweet foods. Managing pain  If told, put ice on the painful area. Put ice in a plastic bag. Place a towel between your skin and the bag. Leave the ice on for 20 minutes, 2-3 times a day. If your skin turns bright red, remove the ice right away to prevent skin damage. The risk of damage is higher if you can't feel pain, heat, or cold. Cover sensitive spots with a bandage, or dressing, to stop clothes from rubbing. Wear loose clothes. General instructions It may take a long time for you to get better. Work closely with your provider. Think about talking with a mental health care provider. They can help you find ways to cope with feeling overwhelmed or hopeless. Have a good support system at home. Think about joining a pain support group. How is this prevented? Vaccines are the best way to prevent shingles and PHN. You should get the vaccine shot for shingles once you're older than 58 years of age. Talk with your provider about getting the shot. Contact a health care provider if: Your medicine isn't helping. You can't manage your pain at home. You feel sad or depressed. Get help right away if: You have thoughts about hurting yourself or others. Get help right away if  you feel like you may hurt yourself or others, or have thoughts about taking your own life. Go to your nearest emergency room or: Call 911. Call the National Suicide Prevention Lifeline at (414)038-7354 or 988. This is open 24 hours a day. Text the Crisis Text Line at (340) 829-8385. This information is not intended to replace advice given to you by your health care provider. Make sure you discuss any questions you have  with your health care provider. Document Revised: 03/16/2023 Document Reviewed: 03/16/2023 Elsevier Patient Education  2024 [employer].

## 2024-09-27 NOTE — Assessment & Plan Note (Signed)
 Ultrasound 09/12/24, possible mild cirrhosis.  Used to drink on weekends years ago, but denies any alcohol use recently.

## 2024-09-27 NOTE — Progress Notes (Signed)
 BP 129/79 (BP Location: Left Arm, Patient Position: Sitting, Cuff Size: Normal)   Pulse 66   Temp 98.6 F (37 C) (Oral)   Resp 15   Ht 5' 6 (1.676 m)   Wt 180 lb 3.2 oz (81.7 kg)   SpO2 97%   BMI 29.09 kg/m    Subjective:    Patient ID: Gavin Chaney, male    DOB: May 24, 1966, 58 y.o.   MRN: [id number]  HPI: Gavin Chaney is a 58 y.o. male  Chief Complaint  Patient presents with   Shingles follow up    Better but still pain and burning. No open sores. Wonders how much longer.    Abnormal ultrasound on 09/12/24 -- concern for possible gall bladder tumor.  Was to go for MRCP and he canceled this.  Discussed with him at length today and he does wish to reschedule this.  SHINGLES Was given Prednisone  and has a few left. Tylenol helps pain. Duration: weeks Location:  right lower chest Painful:  yes Severity: 5/10  Paresthesia:  yes Hyperesthesia: no Itching:  yes Burning:  yes Oozing:  no Blisters:  yes Fevers:  no History of the same:  no Alleviating factors: Prednisone  and Tylenol Status: improving Treatments attempted: as above  Relevant past medical, surgical, family and social history reviewed and updated as indicated. Interim medical history since our last visit reviewed. Allergies and medications reviewed and updated.  Review of Systems  Constitutional:  Negative for activity change, diaphoresis, fatigue and fever.  Respiratory:  Negative for cough, chest tightness, shortness of breath and wheezing.   Cardiovascular:  Negative for chest pain, palpitations and leg swelling.  Gastrointestinal: Negative.   Skin:  Positive for rash.  Neurological: Negative.   Psychiatric/Behavioral: Negative.      Per HPI unless specifically indicated above     Objective:    BP 129/79 (BP Location: Left Arm, Patient Position: Sitting, Cuff Size: Normal)   Pulse 66   Temp 98.6 F (37 C) (Oral)   Resp 15   Ht 5' 6 (1.676 m)   Wt 180 lb 3.2 oz (81.7 kg)   SpO2  97%   BMI 29.09 kg/m   Wt Readings from Last 3 Encounters:  09/27/24 180 lb 3.2 oz (81.7 kg)  09/12/24 183 lb (83 kg)  09/11/24 183 lb 9.6 oz (83.3 kg)    Physical Exam Vitals and nursing note reviewed.  Constitutional:      General: He is awake. He is not in acute distress.    Appearance: He is well-developed and well-groomed. He is not ill-appearing or toxic-appearing.  HENT:     Head: Normocephalic.     Right Ear: Hearing and external ear normal.     Left Ear: Hearing and external ear normal.  Eyes:     General: Lids are normal.     Extraocular Movements: Extraocular movements intact.     Conjunctiva/sclera: Conjunctivae normal.  Neck:     Thyroid: No thyromegaly.     Vascular: No carotid bruit.  Cardiovascular:     Rate and Rhythm: Normal rate and regular rhythm.     Heart sounds: Normal heart sounds. No murmur heard.    No gallop.  Pulmonary:     Effort: No accessory muscle usage or respiratory distress.     Breath sounds: Normal breath sounds.  Abdominal:     General: Bowel sounds are normal. There is no distension.     Palpations: Abdomen is soft.  Tenderness: There is no abdominal tenderness.  Musculoskeletal:     Cervical back: Full passive range of motion without pain.     Right lower leg: No edema.     Left lower leg: No edema.  Lymphadenopathy:     Cervical: No cervical adenopathy.  Skin:    General: Skin is warm.     Capillary Refill: Capillary refill takes less than 2 seconds.     Findings: Rash present.     Comments: Area of shingles rash to anterior lower right chest and upper mid back right side.  No active blisters. Crusting over.  Neurological:     Mental Status: He is alert and oriented to person, place, and time.     Deep Tendon Reflexes: Reflexes are normal and symmetric.     Reflex Scores:      Brachioradialis reflexes are 2+ on the right side and 2+ on the left side.      Patellar reflexes are 2+ on the right side and 2+ on the left  side. Psychiatric:        Attention and Perception: Attention normal.        Mood and Affect: Mood normal.        Speech: Speech normal.        Behavior: Behavior normal. Behavior is cooperative.        Thought Content: Thought content normal.     Results for orders placed or performed in visit on 09/11/24  CBC with Differential/Platelet   Collection Time: 09/11/24 11:30 AM  Result Value Ref Range   WBC 7.3 3.4 - 10.8 x10E3/uL   RBC 5.30 4.14 - 5.80 x10E6/uL   Hemoglobin 14.9 13.0 - 17.7 g/dL   Hematocrit 52.7 62.4 - 51.0 %   MCV 89 79 - 97 fL   MCH 28.1 26.6 - 33.0 pg   MCHC 31.6 31.5 - 35.7 g/dL   RDW 86.8 88.3 - 84.5 %   Platelets 223 150 - 450 x10E3/uL   Neutrophils 62 Not Estab. %   Lymphs 24 Not Estab. %   Monocytes 11 Not Estab. %   Eos 1 Not Estab. %   Basos 1 Not Estab. %   Neutrophils Absolute 4.6 1.4 - 7.0 x10E3/uL   Lymphocytes Absolute 1.7 0.7 - 3.1 x10E3/uL   Monocytes Absolute 0.8 0.1 - 0.9 x10E3/uL   EOS (ABSOLUTE) 0.1 0.0 - 0.4 x10E3/uL   Basophils Absolute 0.0 0.0 - 0.2 x10E3/uL   Immature Granulocytes 0 Not Estab. %   Immature Grans (Abs) 0.0 0.0 - 0.1 x10E3/uL  Comprehensive metabolic panel with GFR   Collection Time: 09/11/24 11:30 AM  Result Value Ref Range   Glucose 88 70 - 99 mg/dL   BUN 12 6 - 24 mg/dL   Creatinine, Ser 9.15 0.76 - 1.27 mg/dL   eGFR 897 >40 fO/fpw/8.26   BUN/Creatinine Ratio 14 9 - 20   Sodium 138 134 - 144 mmol/L   Potassium 4.2 3.5 - 5.2 mmol/L   Chloride 103 96 - 106 mmol/L   CO2 21 20 - 29 mmol/L   Calcium  9.4 8.7 - 10.2 mg/dL   Total Protein 6.9 6.0 - 8.5 g/dL   Albumin 4.6 3.8 - 4.9 g/dL   Globulin, Total 2.3 1.5 - 4.5 g/dL   Bilirubin Total 0.3 0.0 - 1.2 mg/dL   Alkaline Phosphatase 89 47 - 123 IU/L   AST 19 0 - 40 IU/L   ALT 24 0 - 44 IU/L  Amylase  Collection Time: 09/11/24 11:30 AM  Result Value Ref Range   Amylase 42 31 - 110 U/L  Lipase   Collection Time: 09/11/24 11:30 AM  Result Value Ref Range    Lipase 28 13 - 78 U/L      Assessment & Plan:   Problem List Items Addressed This Visit       Nervous and Auditory   Shingles - Primary   Acute and improving.  He will continue Tylenol for pain, reports it is improving.        Other   Abnormal liver diagnostic imaging   Ultrasound 09/12/24, possible mild cirrhosis.  Used to drink on weekends years ago, but denies any alcohol use recently.      Other Visit Diagnoses       Abnormal gall bladder diagnostic imaging       MRCP reordered and recommend he schedule.   Relevant Orders   MR ABDOMEN MRCP W WO CONTAST        Follow up plan: Return in about 5 months (around 02/24/2025) for Annual Physical.

## 2024-11-01 ENCOUNTER — Other Ambulatory Visit: Payer: Self-pay | Admitting: Nurse Practitioner

## 2024-11-04 NOTE — Telephone Encounter (Signed)
 Requested medication (s) are due for refill today: Protonix   Requested medication (s) are on the active medication list: No  Last refill:  2024  Future visit scheduled: Yes  Notes to clinic:  Not on medication list.    Requested Prescriptions  Pending Prescriptions Disp Refills   pantoprazole  (PROTONIX ) 20 MG tablet [Pharmacy Med Name: PANTOPRAZOLE  SOD DR 20 MG TAB] 90 tablet 4    Sig: TAKE 1 TABLET BY MOUTH EVERY DAY     Gastroenterology: Proton Pump Inhibitors Passed - 11/04/2024 12:41 PM      Passed - Valid encounter within last 12 months    Recent Outpatient Visits           1 month ago Herpes zoster without complication   [place] [facility] [place], [place] T, NP   1 month ago Herpes zoster without complication   [place] [facility] [place], Megan P, DO   1 month ago RUQ pain   [place] [facility] [place], Megan P, DO   8 months ago Depression, major, single episode, mild   [place] [facility] [place], Melanie DASEN, NP

## 2024-11-05 MED ORDER — PANTOPRAZOLE SODIUM 20 MG PO TBEC: 20.0000 mg | DELAYED_RELEASE_TABLET | Freq: Every day | ORAL | 4 refills | Status: AC

## 2024-11-05 NOTE — Telephone Encounter (Signed)
 Last prescribed in 2024.

## 2024-11-05 NOTE — Telephone Encounter (Signed)
 Completed.

## 2024-12-07 ENCOUNTER — Other Ambulatory Visit: Payer: Self-pay | Admitting: Nurse Practitioner

## 2024-12-10 NOTE — Telephone Encounter (Signed)
 Requested Prescriptions  Pending Prescriptions Disp Refills   escitalopram  (LEXAPRO ) 10 MG tablet [Pharmacy Med Name: ESCITALOPRAM  10 MG TABLET] 90 tablet 1    Sig: TAKE 1 TABLET BY MOUTH EVERY DAY     Psychiatry:  Antidepressants - SSRI Passed - 12/10/2024 11:31 AM      Passed - Completed PHQ-2 or PHQ-9 in the last 360 days      Passed - Valid encounter within last 6 months    Recent Outpatient Visits           2 months ago Herpes zoster without complication   [place] [facility] [place], [place] T, NP   2 months ago Herpes zoster without complication   [place] [facility] [place], Megan P, DO   3 months ago RUQ pain   [place] [facility] [place], Megan P, DO   9 months ago Depression, major, single episode, mild   [place] [facility] [place], Melanie DASEN, NP

## 2025-04-03 ENCOUNTER — Encounter: Admitting: Nurse Practitioner
